# Patient Record
Sex: Female | Born: 1960 | Race: White | Hispanic: No | Marital: Married
Health system: Midwestern US, Community
[De-identification: ages and names within clinical notes are randomized; demographics above are authoritative.]

## PROBLEM LIST (undated history)

## (undated) DIAGNOSIS — S12100A Unspecified displaced fracture of second cervical vertebra, initial encounter for closed fracture: Secondary | ICD-10-CM

## (undated) DIAGNOSIS — M5412 Radiculopathy, cervical region: Principal | ICD-10-CM

## (undated) DIAGNOSIS — R002 Palpitations: Secondary | ICD-10-CM

## (undated) DIAGNOSIS — G930 Cerebral cysts: Secondary | ICD-10-CM

## (undated) DIAGNOSIS — M5416 Radiculopathy, lumbar region: Principal | ICD-10-CM

## (undated) DIAGNOSIS — G8190 Hemiplegia, unspecified affecting unspecified side: Principal | ICD-10-CM

## (undated) DIAGNOSIS — Z01811 Encounter for preprocedural respiratory examination: Secondary | ICD-10-CM

## (undated) DIAGNOSIS — Z87891 Personal history of nicotine dependence: Secondary | ICD-10-CM

## (undated) DIAGNOSIS — M25552 Pain in left hip: Principal | ICD-10-CM

## (undated) DIAGNOSIS — S12112A Nondisplaced Type II dens fracture, initial encounter for closed fracture: Secondary | ICD-10-CM

## (undated) DIAGNOSIS — E278 Other specified disorders of adrenal gland: Secondary | ICD-10-CM

## (undated) DIAGNOSIS — K219 Gastro-esophageal reflux disease without esophagitis: Secondary | ICD-10-CM

## (undated) DIAGNOSIS — R0602 Shortness of breath: Secondary | ICD-10-CM

## (undated) DIAGNOSIS — IMO0002 Reserved for concepts with insufficient information to code with codable children: Secondary | ICD-10-CM

## (undated) DIAGNOSIS — C801 Malignant (primary) neoplasm, unspecified: Secondary | ICD-10-CM

## (undated) DIAGNOSIS — Z9889 Other specified postprocedural states: Secondary | ICD-10-CM

## (undated) DIAGNOSIS — Z87898 Personal history of other specified conditions: Secondary | ICD-10-CM

## (undated) DIAGNOSIS — R112 Nausea with vomiting, unspecified: Secondary | ICD-10-CM

## (undated) DIAGNOSIS — R52 Pain, unspecified: Secondary | ICD-10-CM

## (undated) DIAGNOSIS — Z1231 Encounter for screening mammogram for malignant neoplasm of breast: Secondary | ICD-10-CM

## (undated) HISTORY — PX: OTHER SURGICAL HISTORY: SHX169

---

## 1999-06-25 NOTE — ED Provider Notes (Signed)
[facility]                      EMERGENCY DEPARTMENT TREATMENT REPORT   NAME:  ARIYAN, SINNETT   MR #:  [id number]   BILLING #: [id number]        DOS: 06/25/1999  TIME: 9:53 P   cc:   Primary Physician:  Jason Nest, M.D.   CHIEF COMPLAINT:   "I fell and have a headache."   HISTORY OF PRESENT ILLNESS:   The patient presents at 2030 hours stating   that she fell out of her attic last night, landing on her head, with 5-7   minutes of loss of consciousness and intermittent blurred vision.  The   patient has her friend with her, who states that she was awake and alert   after the 5 minutes, but the patient does not really remember anything   until waking up this morning.  Of note, she continued to move Christmas   decorations and was acting normal through the evening, although she did get   nauseated and vomit several times last night, none since.   The patient also has some pain of her left hip and left small finger, none   of which she feels are broken, just bruised. She has full range of motion   of both and minimal swelling.  The patient does not have any actual neck   pain, but states it feels very stiff on both sides, especially when she   tries to twist or bend it.  No radiation, paresthesias, further loss of   consciousness, or any other symptoms or injuries. She does note that this   has given her "quite a migraine".   REVIEW OF SYSTEMS:   CONSTITUTIONAL:  No fever, chills, weight loss.   HEMATOLOGIC/LYMPHATIC:  No excessive bruising or lymph node swelling.   RESPIRATORY:  No cough, shortness of breath, or wheezing.   CARDIOVASCULAR:  No chest pain, chest pressure, or palpitations.   GASTROINTESTINAL:  No vomiting, diarrhea, or abdominal pain, except as   above.   GENITOURINARY:  No dysuria, frequency, or urgency, denies any chance of   pregnancy.   INTEGUMENTARY:  No rashes.   NEUROLOGICAL:  The patient denies confusion, ataxia, weakness, radiation,    paresthesias, changed vision/speech/hearing, dizziness, loss of   consciousness, or N/V, except as above.   All other systems negative.   PAST MEDICAL HISTORY, FAMILY HISTORY, SOCIAL HISTORY:  All noncontributory.   ALLERGIES:  NONE.   MEDICATIONS:  Xanax, ibuprofen.   PHYSICAL EXAMINATION:   VITAL SIGNS:  Blood pressure 106/66, pulse 86, respirations 20, temperature   98.7.   GENERAL APPEARANCE:  Patient appears well-developed and well-nourished.   Appearance and behavior are age and situation appropriate.   HEENT:   Eyes:  Conjunctiva clear, lids normal.  Pupils equal, symmetrical,   and normally reactive.   Fundi:  Optic discs are normal in appearance, no   gross hemorrhages or exudates seen.   Ears/Nose:  Hearing is grossly intact   to voice.  Internal and external examinations of the ears are unremarkable,   except for serous fluid in the left.  Mouth/Throat:  Surfaces of the   pharynx, palate, and tongue are pink, moist, and without lesions.   ENT:   Without oto/rhinorrhea, hemotympanum, raccoon's eyes, or Battle's sign.   NECK:  Negative pain on palpation/deformity of the spine; positive pain on   palpation/bilateral spasm paraspinous cervical muscles.  RESPIRATORY:  Clear and equal BS.  No respiratory distress, tachypnea, or   accessory muscle use.   CARDIOVASCULAR:  Heart regular, without murmurs, gallops, rubs, or thrills.   PMI not displaced.   CHEST:  Chest symmetrical without masses or tenderness.   GI:  Abdomen soft, nontender, without complaint of pain to palpation.  No   hepatomegaly or splenomegaly.   CHEST/ABDOMEN/PELVIS: Nontender with AP and lateral compression.   SCALP:  There are multiple abrasions and contusions over the occipital,   frontal, left frontal, and left parietal area.  No crepitus or deformity   otherwise.   SKIN:  This is some abrasions over the left hip and left 5th finger, no   signs of infection.  Distal neurovasculars/range of motion intact of both.    NEUROLOGIC:  Cranial nerves, deep tendon reflexes, strength, and light   touch sensation are unremarkable.   PSYCHIATRIC:  Oriented to time, place and person.  Mood and affect   appropriate.  Judgment appears appropriate.  Recent and remote memory   appear to be intact.   IMPRESSION/MANAGEMENT PLAN:   The patient's history was discussed  with   family members.  No additional relevant information was obtained. Nursing   notes were reviewed. The patient will need a CAT scan of her head because   of prolonged loss of consciousness and loss of memory as well as neck   films.   DIAGNOSTIC INTERPRETATION:  CAT scan of the head is read negative by   radiologist.   COURSE IN THE EMERGENCY DEPARTMENT: The patient had a C spine x-ray done   because of the patient's neck pain, and had an abnormal odontoid.  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   In the setting of neurological deficit, MRI of C spine may be warranted.   I explained to the patient and her daughter that she has this congenital   problem, but she has no neurological symptoms tonight.  She is to try to   avoid any head injuries and return if any problems.  She is discharged on   Skelaxin and Vicodin.   DIAGNOSIS:   1.  Closed head injury.   2.  Abrasions face, left hip, left fifth finger.   3.  Congenital neck instability.   DISPOSITION: The patient is discharged home in stable condition, with   instructions to follow up with their regular doctor.  They are advised to   return immediately for any worsening or symptoms of concern.   Electronically Signed By:   Ferdinand Lango. MANOLIO, M.D. 06/27/1999 15:22   ____________________________   Ferdinand Lango. Jama Flavors, M.D.   dh/jh  D:  06/25/1999 T:  06/25/1999  9:57 P   [id number]

## 2001-04-11 NOTE — ED Provider Notes (Signed)
[facility]                      EMERGENCY DEPARTMENT TREATMENT REPORT   NAME:  [name], [name]   MR #:  [id number]   BILLING #: [id number]        DOS: 04/11/2001  TIME: 1:10 P   cc:   Frann Rider, M.D.   Primary Physician:   Frann Rider, M.D.   CHIEF COMPLAINT:  Toothache.   HISTORY OF PRESENT ILLNESS:  [age]year-old white female presents with   severe pain with some swelling in the left lower jaw that has started in   the past few days.  About one week ago she was eating food and a part of a   filling and/or tooth came out of the first molar on the left lower jaw   area.  She did not have pain initially, but in the past few days has   developed pain and now it has become severe and rates it 10/10.  She states   she has used two tubes of Anbesol and has been taking Motrin 800 mg without   relief.  She has called the dentist and was able to get an appointment for   05/16/01.   REVIEW OF SYSTEMS:   CONSTITUTIONAL:  No fever, chills, weight loss.   Denies complaints in any other system.   PAST MEDICAL HISTORY:  No chronic illnesses.   SOCIAL HISTORY:  Positive for smoking.   ALLERGIES:  None.   MEDICATIONS:  Ibuprofen and Anbesol.   PHYSICAL EXAMINATION:   VITAL SIGNS:  Blood pressure 117/76; temperature 97.5; pulse 86;   respirations 24.   GENERAL:  The patient appears significantly uncomfortable secondary to her   left lower jaw pain.   HEENT:  Face:  There is some mild swelling of the left lower jaw area.   Mouth: Tooth #20 does have a filling that is absent with some caries   present and is very tender to even light percussion.  The gingival area   shows no inflammation.   IMPRESSION AND MANAGEMENT PLAN: That of a patient presenting with acute   dental pain and appears to be carious with secondary abscess and will   antibiotics and analgesia until she can see her dentist.   Dr. Claudette Laws examined the patient and agrees with the above history,    physical, and treatment plan.   FINAL DIAGNOSIS:   1.  Acute dental pain, caries/abscess, #20.   DISPOSITION:  The patient is discharged home in stable condition, with   instructions to follow up with their regular doctor.  They are advised to   return immediately for any worsening or symptoms of concern. Pen VK 500 mg   #28, Vicodin #16 and was given two by mouth prior to discharge.  Preprinted   dental abscess instructions given.   Electronically Signed By:   Docia Furl, M.D. 04/15/2001 05:03   ____________________________   Docia Furl, M.D.   cd D:  04/11/2001 T:  04/11/2001  4:40 P   [id number]   Claris Pong, PA

## 2002-10-29 NOTE — ED Provider Notes (Signed)
[facility]                      EMERGENCY DEPARTMENT TREATMENT REPORT   NAME:  [name], [name]                    PT. LOCATION:     ER  NG29   MR #:         BILLING #: [id number]          DOS: 10/29/2002   TIME: 4:36 P   02-60-61   cc:   Primary Physician:   CHIEF COMPLAINT: Rectal pain.   HISTORY OF PRESENT ILLNESS:  A 42 year old female states she got a   thrombosed hemorrhoid.  She has had bleeding on and off for 2 days.  She   states it is very painful and hurts to have a bowel movement.  She is able   to have bowel movements.  She has not been constipated.  She had had   hemorrhoids before.   REVIEW OF SYSTEMS   RESPIRATORY:  No cough, shortness of breath, or wheezing.   CARDIOVASCULAR:  No chest pain, chest pressure, or palpitations.   GASTROINTESTINAL:   Per HPI.   PAST MEDICAL HISTORY:  Hemorrhoids and seasonal allergies.   SOCIAL HISTORY: Here with a friend.   PHYSICAL EXAMINATION:   VITAL SIGNS:  Stable.   GASTROINTESTINAL:   Abdomen is soft, nontender, nondistended.  Rectal   examination:  She has 2 small nonthrombosed hemorrhoids, no bleeding noted.   INITIAL ASSESSMENT AND MANAGEMENT PLAN:  A patient with rectal hemorrhoids.   We will try to reduce them.   COURSE IN THE EMERGENCY DEPARTMENT:  The hemorrhoids were manually reduced   by   Dr. Katharine Look.  She is more comfortable.  We will discharge her home with   [facility], a few Vicodin she can have to take only at night.  She will be   continuing sitz baths and following up with Dr. Isabella Bowens and Dr. Vassie Moselle.   FINAL DIAGNOSIS:  Acute rectal pain secondary to hemorrhoids.   DISPOSITION:  Home.  The patient is discharged home in stable condition,   with instructions to follow up with their regular doctor.  They are advised   to return immediately for any worsening or symptoms of concern.   Electronically Signed By:   Docia Furl, M.D. 10/31/2002 20:28   ____________________________   Docia Furl, M.D.    rew  D:  10/29/2002  T:  10/30/2002  8:55 P   [id number]

## 2005-03-19 NOTE — ED Provider Notes (Signed)
[facility]                      EMERGENCY DEPARTMENT TREATMENT REPORT   NAME:  [name], [name]                    PT. LOCATION:     ER  ER07   MR #:         BILLING #: [id number]          DOS: 03/19/2005   TIME: 9:32 A   02-60-61   cc:    F. Nadara Mustard., M.D.   Primary Physician:   CHIEF COMPLAINT:   Abdominal pain, vomiting, diarrhea.   HISTORY OF PRESENT ILLNESS:   The patient is a healthy 44 year old female   who at a chicken biscuit around 1000 hours several days ago.  About 1400   hours she started experiencing colicky pain, vomiting, and then diarrhea.   Since then she has continued to experience vomiting and diarrhea, has had   generalized malaise, extremity aching, bitemporal headache.  She is   attempting over-the-counter medications with some limited relief but has   continued to experience pain and vomiting.  Because of the progression and   severity of the patient's symptoms, they felt obligated to come to the   emergency department for treatment.   REVIEW OF SYSTEMS:   CONSTITUTIONAL:   The patient has been experiencing chills.  No orthostatic   symptoms.   ENT: No sore throat, runny nose or other URI symptoms.   RESPIRATORY:  No cough, shortness of breath, or wheezing.   CARDIOVASCULAR:  No chest pain, chest pressure, or palpitations.   GENITOURINARY:  No dysuria, frequency, or urgency.   MUSCULOSKELETAL:  No joint pain or swelling.   NEUROLOGICAL:  No headaches, sensory or motor symptoms.   Denies complaints in any other system.   PAST MEDICAL HISTORY:  Negative for surgeries, gastrointestinal problems.   SOCIAL HISTORY: The patient is married.   ALLERGIES:  Codeine, tape.   MEDICATIONS:  None.   PHYSICAL EXAMINATION:   GENERAL:   Alert and uncomfortable.   VITAL SIGNS:  Blood pressure 105/60, pulse 102, respirations 18,   temperature 98.7.  O2 saturations  99%.   HEENT:   Mucous membranes pink and dry.  Airway patent.    RESPIRATORY:  Clear and equal breath sounds.  No respiratory distress,   tachypnea, or accessory muscle use.   CARDIOVASCULAR:  Heart regular, without murmurs, gallops, rubs, or thrills.   PMI not displaced.   ABDOMEN:   Soft with mild poorly localized pain.  Peristalsis is present.   SKIN:  Warm and dry without rashes.   MUSCULOSKELETAL:   Stance and gait appear normal.   NEUROLOGIC:  Cranial nerves, deep tendon reflexes, strength, and light   touch sensation are unremarkable.   INITIAL IMPRESSION/MANAGEMENT PLAN:   The patient presents with acute   abdominal pain, vomiting, requiring evaluation to identify hepatic,   pancreatic, biliary disease, leukocytes or surgical indications.   DIAGNOSTIC STUDIES:   Chemistries were normal. White blood cell count   elevated 15,900, hemoglobin 11.8.  Urine was strongly concentrated,   positive for blood.  Lipase negative.   COURSE IN THE EMERGENCY DEPARTMENT:   The patient was hydrated with IV   fluids, medicated with IV Dilaudid, Phenergan and injection of Bentyl.  A   repeat evaluation at 0920 hours, the patient appeared considerably more   comfortable and  was tolerating liquids.  The patient's family was in the   department and kept up-to-date on the patient's condition and test results.   FINAL DIAGNOSES:      1. Acute abdominal pain.      2. Gastroenteritis secondary to food poisoning.   DISPOSITION:   The patient was discharged with clear liquids, Percocet as   needed for pain.  Follow up her primary care physician.  She may return to   the emergency department any time should there be a change in the patient's   condition or the onset of new or worsening symptoms.   Electronically Signed By:   Thornton Dales, M.D. 03/20/2005   11:04   ____________________________   Thornton Dales, M.D.   dh  D:  03/19/2005  T:  03/19/2005  8:04 P   [id number]

## 2005-03-23 NOTE — ED Provider Notes (Signed)
[facility]                      EMERGENCY DEPARTMENT TREATMENT REPORT   NAME:  [name], [name]                  PT. LOCATION:     ER  ER16   MR #:         BILLING #: [id number]          DOS: 03/23/2005   TIME: 8:02 P   02-60-61   cc:    F. Nadara Mustard., M.D.   Primary Physician:   Time of my Evaluation:  1943.   CHIEF COMPLAINT:  Pain in belly area.   HISTORY OF PRESENT ILLNESS:  This 44 year old female presents complaining   of continued symptoms x 7 days after she ate a chicken biscuit.  Positive   nausea, vomiting, and diarrhea multiple times.  Seen here on 03/19/05.  She   was medicated and felt better that last few days.  She has been eating.   Occasional nausea associated with eating, not necessarily associated with   eating, but sometimes she can/cannot.  Continued abdominal discomfort now   localized to the lower abdominal region.  She has also had a urinary   hesitancy.  No vaginal bleeding since the 03/21/05.  Normal vaginal menstrual   cycle for her. No weakness.  She had a tactile fever the last 3-4 days.   Abdominal cramps are diffuse.  She is concerned and she presents.   REVIEW OF SYSTEMS: CONSTITUTIONAL:  As above.   ENT: No sore throat, runny nose or other URI symptoms.   ENDOCRINE:  No diabetic symptoms.   ALLERGIC/IMMUNOLOGIC:  No urticaria or allergy symptoms.   GASTROINTESTINAL:  As above.   GENITOURINARY:  No dysuria, frequency, or urgency.   NEUROLOGICAL:  No headaches, sensory or motor symptoms.   All other systems negative.   Denies complaints in any other system.   PAST MEDICAL HISTORY:  Noncontributory.   SOCIAL HISTORY:  Here with her husband.  Positive tobacco.  Denies alcohol   or drugs.   ALLERGIES:  Codeine.   MEDICATIONS:  Zantac, Tylenol, Naprosyn.   PHYSICAL EXAMINATION:   VITAL SIGNS:  Blood pressure 118/71, pulse 95, respirations 20, temperature   99.6.  Pain is 9/10.    CONSTITUTIONAL:  A 44 year old female with no complaints as I walk in, and   then she complains while holding her belly that it is cramping.   LYMPHATICS:  No cervical or submandibular lymphadenopathy palpated.  No   inguinal lymphadenopathy palpated.   RESPIRATORY:  Clear and equal.   CHEST:  Chest symmetrical without masses or tenderness.   CARDIOVASCULAR:  Regular rate and rhythm of 90.  No peripheral edema or   significant varicosities. Vascular:   Calves soft and nontender.  No   peripheral edema or significant varicosities. Carotid, femoral, and pedal   pulses are satisfactory.  The abdominal aorta is not palpably enlarged.   GASTROINTESTINAL:  Round, soft, mild, poorly localized discomfort.  Normal   active bowel sounds.  No guarding, no rigidity.  MUSCULOSKELETAL:  Stance   and gait appear normal.   SKIN:  Warm and dry without rashes.  Old remote scar of the umbilicus.   CONTINUATION BY DR. Claudette Laws:   INITIAL ASSESSMENT AND MANAGEMENT PLAN:  The patient was treated by myself   earlier this week for what appeared to be an  acute gastroenteritis;   however, she returned with persistent discomfort and now has some   developing left lower quadrant pain requiring evaluation to identify   diverticulitis or other surgical indications.   DIAGNOSTIC STUDIES: Abdominal pelvic CT demonstrated sigmoid diverticulitis   with a small area of extra luminal fluids suggestive of an early abscess.   Chemistries were normal.  White blood count 11,000, hemoglobin 11.4.   COURSE IN THE EMERGENCY DEPARTMENT:  The patient remained stable. Her pain   was controlled with increments of Dilaudid and Phenergan.  The patient's   family was in the department and kept up-to-date on the patient's condition   and test results.   FINAL DIAGNOSES:      1. Acute left lower quadrant abdominal pain.      2. Acute diverticulitis.   DISPOSITION:  At the time of this dictation, phone consultation with Dr.    Hyacinth Meeker, who is on call for the emergency department, is pending to arrange   final disposition.   Electronically Signed By:   Thornton Dales, M.D. 03/27/2005   11:00   ____________________________   Thornton Dales, M.D.   mw/rew  D:  03/23/2005  T:  03/23/2005  9:08 P   [id number](pt2)   Beverly Sessions, PA-C

## 2005-03-24 NOTE — H&P (Signed)
[facility]                              HISTORY AND PHYSICAL                             [place] W. Hyacinth Meeker, M.D.   NAMENIOBE, DICK   MR #:    [id number]                    ADM DATE:        03/24/2005   BILLING  [id number]                   PT. LOCATION     [id number]   #:   SS #     [id number]   Acie Fredrickson. MILLER, M.D.   cc:    Acie Fredrickson. MILLER, M.D.   CHIEF COMPLAINT:    Abdominal pain.   HISTORY OF PRESENT ILLNESS:   The patient is a 44 year old white female   without significant past medical problems who presents with 7 days of   recurrent nausea, vomiting, diarrhea and diffuse abdominal pain of varying   severity.  Indicates that the pain has been mainly focused in the   suprapubic and left lower quadrant although a few days ago, it was more   focused in the epigastric region.  She has also had some sense of dyspnea   and chest "tightness" in the past week but this has only been when the   abdominal pain became quite severe and dissipated after the abdominal pain   cleared.  For much of the last 7 days, she has not been able to eat or   drink adequately.  She has also had some urinary hesitancy, mild dysuria,   no hematuria.  She does not see any blood in diarrhea.  She has a positive   subjective sense of fevers, chills and sweats.   REVIEW OF SYSTEMS:   A 10-point system was negative other than those things   outlined above.   PAST MEDICAL HISTORY:    None.   PAST SURGICAL HISTORY:   Positive only for removal of tubal pregnancy and   bilateral tubal ligation in 1990.   ALLERGIES:  Reported allergy to codeine causing nausea and itching.   MEDICATIONS:  Only things taken in the past week are Zantac, Tylenol and   Naprosyn for the above mentioned symptoms.   SOCIAL HISTORY:    She is married, smokes about a third of a pack of   cigarettes per day.  Rarely uses alcohol.  No illicit drug use.   FAMILY HISTORY:   Positive for diabetes in her mother.    PHYSICAL EXAMINATION:   VITAL SIGNS:   Temperature 100.4, blood pressure 107/66, heart rate 89,   respiratory rate 20, saturation 98% on room air.   GENERAL:  Patient is alert, oriented X 3, in no acute distress.   HEENT:   Extraocular eye muscles are intact.  Pupils are equal, round and   reactive to light.  There is  no scleral icterus or conjunctival injection.   No nasal discharge.  Oropharynx is free of any erythema or exudate.  Mucous   membranes are moist.   NECK:  Supple without appreciable JVD, lymphadenopathy or thyromegaly.   HEART:  Reveals regular rate and rhythm without murmurs,  rubs or gallops.   LUNGS:  Clear to auscultation bilaterally.   ABDOMEN:  Soft with mild to moderate tenderness in the epigastric region,   also in the suprapubic region, otherwise mild diffuse discomfort to   palpation.  She does seem to have some suprapubic fullness but no discrete   bladder margins palpable.  No hepatosplenomegaly or masses are palpable.   GU/RECTAL:  Exam deferred.   EXTREMITIES:  No clubbing, cyanosis or edema.  Distal pulses are palpable   and strong x4.   NEUROLOGIC:  Cranial nerves II-XII are intact.  She moves extremities x4   without any evidence of weakness and light touch sensation is intact.   PERTINENT DIAGNOSTIC STUDIES:  CT scan of the abdomen which shows focal   wall thickening, fat stranding around the proximal sigmoid colon consistent   with diverticulitis, also 1.5 cm pocket of fluid adjacent to the proximal   sigmoid colon which "may be developing diverticular abscess."  Bladder was   not distended.   Basic chemistry panel was normal in all respects.  SGPT 86, alkaline   phosphatase normal at 112, lipase 769, albumin 2.5, white count 11,   hemoglobin 11.4, platelet count 391,000.   ASSESSMENT AND PLAN:      1. Sigmoid diverticulitis, continue Flagyl and Levaquin IV.  Question of         early abscess formation on CT.  Will monitor this clinically.       2. Pancreatitis.  Etiology of this is unclear.  Patient does not drink         any significant amount of alcohol.  She does have mild elevation of         SGPT, therefore, right upper quadrant ultrasound will be performed to         rule out evidence of choledocholithiasis being behind this.  CT scan         reports the pancreas to be "normal."  Will await follow up evaluation         by our radiologists as well.   Renae Fickle, thank you very much for allowing [place] to participate in the care of   this patient.   Electronically Signed By:   Acie Fredrickson. MILLER, M.D. 03/26/2005 16:57   ____________________________   Acie Fredrickson. Hyacinth Meeker, M.D.   [place].Scarce  D:  03/24/2005  T:  03/24/2005  3:35 P   [id number]

## 2005-03-25 NOTE — Consults (Signed)
[facility]                               CONSULTATION REPORT                         CONSULTANT:  Grier Mitts, M.D.   [name], [name]   BILLING #:  [id number]             DATE OF CONSULT:     03/25/2005   MR #:       [id number]              ADM DATE:            03/24/2005   SS #        [id number]           PT. LOCATION:        [id number]   ATTENDING:  Acie Fredrickson. MILLER, M.D.   cc:    Acie Fredrickson. MILLER, M.D.          Grier Mitts, M.D.   REASON FOR CONSULTATION:   Sigmoid diverticular abscess.   HISTORY OF PRESENT ILLNESS:   A 44 year old white female who has been sick   since last Friday.  Apparently, she had some chicken burrito.  After eating   the outside food, patient started having nausea, vomiting and diarrhea.   She came to the emergency room.  She was diagnosed to have gastroenteritis   and treated.  She came back again.  This time, she started developing   fever, chills and sweating.  She has no appetite.  She has not been eating   very well.  She had diarrhea.  She had a small liquid bowel movement   yesterday.  No blood in the stool.  The pain is felt in the left lower   quadrant and suprapubic area, intense, controlled with pain medications.   She feels a knot in the left lower quadrant.  She denies any weight loss.   For the past 2 weeks prior to that, she has been constipated.   REVIEW OF SYSTEMS:   No weight loss, no chest pain, no shortness of breath.   Rest of the review of systems is unremarkable.  She is otherwise healthy.   PAST MEDICAL HISTORY:   Unremarkable.   PAST SURGICAL HISTORY:   Tubal pregnancy and tubal ligation in 1990.   ALLERGIES:  Codeine   MEDICATIONS:   Zantac, Tylenol, Naprosyn.   SOCIAL HISTORY:   She smokes, social drinker.   FAMILY HISTORY:    Diabetes.   PHYSICAL EXAMINATION:   GENERAL:  She was in pain, not anemic or jaundiced.   VITAL SIGNS:   She is febrile, temperature 101, blood pressure 114/67,   pulse 102.    NECK:  Supple.  No adenopathy.   CHEST:  Symmetrical.   LUNGS:  Air entry normal both sides, no added sounds.   CARDIOVASCULAR SYSTEM:  Heart tone normal, no murmur.   ABDOMEN:  Slightly distended.  Bowel sounds are hypoactive. There is scar   at the umbilicus.  There is tenderness in the left lower quadrant with   voluntary guarding and moderate rebound.  No enlargement of liver or   spleen.  There may be a vague mass palpable in the left lower quadrant.  No   evidence of hernia.  CNS:  No gross deficits.   EXTREMITIES:   Palpable pedal pulses.   PERTINENT DIAGNOSTIC STUDIES:  Electrolytes normal, white count 11,   hemoglobin 11.4.   CT of the abdomen reveals 2.8-cm abscess around the sigmoid with air   bubbles.   DIAGNOSIS:  Sigmoid diverticular abscess.   RECOMMENDATIONS:   A trial of medical management as diverticular disease is   very uncommon in young patients.  It is possible she may have one isolated   diverticulum.  If she responds to antibiotic treatment, she may avoid   sigmoid colon resection and colostomy.  She should be kept n.p.o. except   for ice chips.  She will be given Zosyn and vancomycin.  I will follow the   patient very closely with you.  I have already discussed my recommendation   with Dr. Marc Morgans.   Thank you for this consultation.   Electronically Signed By:   Grier Mitts, M.D. 03/31/2005 12:16   _________________________________   Grier Mitts, M.D.   [place]al[place].Scarce  D:  03/25/2005  T:  03/25/2005 11:14 P   [id number]

## 2005-04-02 NOTE — Discharge Summary (Signed)
[name]                                DISCHARGE SUMMARY   [name], [name]   E:   MR  [id number]                          ADM DATE:      03/24/2005   #:   [name]  [id number]                       DIS DATE:      04/02/2005   #   [name], M.D.   cc:    [name], M.D.          Claudie Leach., M.D.   DISCHARGE DIAGNOSES:      1. Diverticulitis with small abscess suggested by CT.      2. Initial elevated lipase although actual pancreatitis is doubtful and      not supported by a CT.      3. Anemia with hemoglobin most recently stable at 8.9 without evidence      of overt bleed, note has hypokalemia.   DISCHARGE MEDICATIONS:      1. Flagyl 500 mg p.o. t.i.d. to complete a total of 3 weeks antibiotic      therapy.      2. Levaquin 500 mg p.o. q.p.m. to complete 3 weeks.      3. Potassium supplementation.   HOSPITAL COURSE:  The patient is a 44 year old white female who came in   with complaints of abdominal pain, also nausea, vomiting and diarrhea.  She   was found by CT to have evidence of sigmoid diverticulitis with question of   a small early abscess.  The patient also had initial elevation of lipase   although she had had some symptoms suggestive of pancreatitis early on in   the course of her symptoms.  These had abated.  Her CT did not show   evidence of pancreatitis.  The patient does not drink alcohol.  She did   have some mild elevation of SGPT although no alkaline phosphatase.  She had   a normal right upper quadrant ultrasound with no recurrence of these   symptoms.   In regards to the diverticulitis, the patient was started on antibiotic   therapy with Zosyn which was subsequently switched to Levaquin and Flagyl   which she is tolerating well.  The patient is taking p.o. well at the time   of discharge.  She has no pain at the time of discharge and is requesting   to be discharged to home.  The patient will be discharged.  As noted above    there was some question of early abscess formation on the initial CT.   Follow up CT scan was performed on the 15th which showed a small partially   loculated fluid collection posterior to the sigmoid which is felt to not be   significantly changed and other anteromedial collection had markedly   improved.  The patient again looked  very good clinically and was   tolerating p.o. medications.  She was discharged to home to follow up with   Dr. Isabella Torres as an outpatient.  I have asked that she conclude a 3-week course   of antibiotic as opposed to a usual 10-14  days.  The patient has also been   stringently counseled in regards to returning if she develops severe   abdominal pain or fevers and she voices clear understanding of this.  The   patient was also counseled in regards to advancing her diet gradually.   The patient had some hypokalemia which was undergoing repletion still at   the time of discharge although it was improving.  It was 3.0 at discharge.   The patient also after initial IV fluids showed a drop in her hemoglobin   from 11.4 down to a low of 8.6.  It is presently 8.9 at the time of   discharge without any frank bleeding other than some mild hemorrhoidal   bleeding.  The patient does have a decreased iron saturation; therefore   iron supplement will be given and she can follow up with Dr. Isabella Torres for this   as well.   Renae Fickle, thank you for allowing [place] to participate it the care of this patient.   If any questions arise, please feel free to contact me through our office   [phone]).   Electronically Signed By:   [name], M.D. 04/04/2005 18:26   ________________________________   Abigail Fredrickson. Hyacinth Meeker, M.D.   Andria Rhein  D:  04/02/2005  T:  04/02/2005  8:03 P   [id number]

## 2005-06-21 NOTE — Procedures (Signed)
[facility]                                 PROCEDURE NOTE                                Mike Gip, M.D.   [facility] [place], [place]   E:   MR  [id number]                         DATE:            06/21/2005   #:   [name]  [id number]                      PT. LOCATION:    [id number]   #   Mike Gip, M.D.   cc:    F. Nadara Mustard., M.D.          Mike Gip, M.D.   Extra copies to office:  0   REFERRING PHYSICIAN:   Dr. Renae Fickle Old   OPERATOR:   Dr. Nolon Nations   PROCEDURE:   Colonoscopy   INDICATION:   Colon cancer screening in a 44 year old female, status post an episode of   acute diverticulitis in September.  Symptoms have resolved.  Now for   screening colonoscopy.   ASSISTANTS:   Toula Moos and Lynnae January, RN   MEDICATIONS:   Demerol 125 mg, Versed 6 mg given in slow incremental doses   SCOPE:   Olympus PCF-Q180   DESCRIPTION OF PROCEDURE:  The nature of the procedure, including potential   complications such as bleeding, perforation, infection and alternatives,   were discussed with the patient and signed consent was obtained.  The   patient was placed in the left lateral decubitus position.  After adequate   IV monitored sedation a colonoscope was introduced through the rectum and   advanced to the cecum.  It was identified by typical landmarks.  A careful   examination was performed as the scope was withdrawn.  The patient   tolerated the procedure well and there were no apparent complications.   FINDINGS:   Rectal examination was normal with no masses, polyps or fissures.  The prep   was adequate.  There were scattered left-sided diverticula, although not   numerous.  There was some mild associated resulting inflammation.  There   were no masses, vascular lesions or polyps seen.  There were moderate-sized   internal hemorrhoids on retroflexion.  The mucosa was otherwise   unremarkable.   PROCEDURE PERFORMED:   Colonoscopy   IMPRESSIONS:       1. Left-sided colonic diverticulosis with some evidence to suggest      previous diverticulitis.      2. Internal hemorrhoids.      3. No polyps or mass lesions were seen.   RECOMMENDATIONS:      1. Increase dietary fiber.      2. Routine screening colonoscopy at age 22.      3. Follow-up with Dr. Isabella Bowens.   Electronically Signed By:   Mike Gip, M.D. 06/23/2005 13:11   _________________________________   Mike Gip, M.D.   sc  D:  06/21/2005  T:  06/21/2005  4:29 P   [id number]

## 2005-06-21 NOTE — Procedures (Signed)
[facility]                                 PROCEDURE NOTE                                Mike Gip, M.D.   [facility] [place], [place]   E:   MR  [id number]                         DATE:            06/21/2005   #:   Lindley Magnus  [id number]                      PT. LOCATION:    [id number]   #   Mike Gip, M.D.   cc:    F. Nadara Mustard., M.D.          Mike Gip, M.D.   Extra copies to office:  0   REFERRING PHYSICIAN:   Dr. Renae Fickle Old   OPERATOR:   Dr. Nolon Nations   PROCEDURE:   Colonoscopy   INDICATION:   Colon cancer screening in a 44 year old female, status post an episode of   acute diverticulitis in September.  Symptoms have resolved.  Now for   screening colonoscopy.   ASSISTANTS:   Toula Moos and Lynnae January, RN   MEDICATIONS:   Demerol 125 mg, Versed 6 mg given in slow incremental doses   SCOPE:   Olympus PCF-Q180   DESCRIPTION OF PROCEDURE:  The nature of the procedure, including potential   complications such as bleeding, perforation, infection and alternatives,   were discussed with the patient and signed consent was obtained.  The   patient was placed in the left lateral decubitus position.  After adequate   IV monitored sedation a colonoscope was introduced through the rectum and   advanced to the cecum.  It was identified by typical landmarks.  A careful   examination was performed as the scope was withdrawn.  The patient   tolerated the procedure well and there were no apparent complications.   FINDINGS:   Rectal examination was normal with no masses, polyps or fissures.  The prep   was adequate.  There were scattered left-sided diverticula, although not   numerous.  There was some mild associated resulting inflammation.  There   were no masses, vascular lesions or polyps seen.  There were moderate-sized   internal hemorrhoids on retroflexion.  The mucosa was otherwise   unremarkable.   PROCEDURE PERFORMED:   Colonoscopy   IMPRESSIONS:      1. Left-sided colonic diverticulosis  with some evidence to suggest      previous diverticulitis.      2. Internal hemorrhoids.      3. No polyps or mass lesions were seen.   RECOMMENDATIONS:      1. Increase dietary fiber.      2. Routine screening colonoscopy at age 36.      3. Follow-up with Dr. Isabella Bowens.   Electronically Signed By:   Mike Gip, M.D. 06/23/2005 13:11   _________________________________   Mike Gip, M.D.   sc  D:  06/21/2005  T:  06/21/2005  4:29 P   [id number]

## 2005-09-27 NOTE — ED Provider Notes (Signed)
[facility]                      EMERGENCY DEPARTMENT TREATMENT REPORT   NAME:  [name], [name]                    PT. LOCATION:     ER  [phone]   MR #:         BILLING #: [id number]          DOS: 09/27/2005   TIME: 9:02 A   [id number]   cc:    F. Nadara Mustard., M.D.   Primary Physician:   CHIEF COMPLAINT:  Ear pain, cannot see.   HISTORY OF PRESENT ILLNESS:  A 45 year old woman has a 3 day history of ear   pain and swelling.  Also has some decreased hearing and the pain has spread   to her face, a little bit to her neck.  Since yesterday she has had   decrease in her vision in that eye, there has been some watering and   decreased ability to close that eye.  Had some pain in her face this   morning.  She notes a headache which is also left sided and a "tired   feeling" in her left arm.  This morning when she looked in the mirror her   face was drooping on that side and has had decreased ability to close her   eye.  She has not had any speech difficulty, no problems with her arms or   legs.  She did, today, feel like she was having difficulty with her   thinking.  She felt in some sense confused as she was driving and felt the   necessity to pull over.  In terms of her vision she normally wears just   reading glasses.  She covers the affected eye and her vision is less.   REVIEW OF SYSTEMS:   CONSTITUTIONAL:  She had a fever of 100 yesterday.   ENT:  As above.   RESPIRATORY:  No cough, shortness of breath, or wheezing.   CARDIOVASCULAR:  No chest pain, chest pressure, or palpitations.   GASTROINTESTINAL:  No vomiting, diarrhea, or abdominal pain.   GENITOURINARY:  No dysuria, frequency, or urgency.   MUSCULOSKELETAL:  No injury.   NEUROLOGICAL:  As above.   Denies complaints in any other system.   PAST MEDICAL HISTORY:  Diverticulitis, spent some time in the hospital on   one occasion with this, no abdominal pain.    FAMILY HISTORY:  There is no discernible pattern for premature familial   illness.   SOCIAL HISTORY:  Smokes.   ALLERGIES:  Not listed.   MEDICATIONS:  Not listed.   PHYSICAL EXAMINATION:   VITAL SIGNS:  Blood pressure 132/71, pulse 82, respirations 20, temperature   97.9, oxygen saturation not recorded.   GENERAL:  This is a well developed anxious but relatively comfortable   45 year old woman.   LUNGS:  Clear and equal breath sounds, no respiratory distress or   tachypnea.   HEART:  Regular, without significant murmurs, gallops, or rubs.   ABDOMEN:  Soft, nontender, without complaint of pain to palpation.   BACK:  No CVAT.   SPINE:  There is no localized cervical, thoracic, lumbar or sacral bony   tenderness to palpation or fist percussion.  There are no bony step-offs,   ecchymoses, areas of soft tissue swelling or deformities.   HEAD, EARS, EYES,  NOSE, THROAT:  Pupils equal and reactive, extraocular   movements are normal.  She appears to have an early Bells palsy, she does   not completely close her left eye and it does come open easier than the   other side.  The face is not symmetrical, it seems to droop on the left but   improves with voluntary motor movement.  She does have some loss of   forehead musculature as well.  The left external auditory canal is swollen   and tender, the left pinna is symmetrically and mildly swollen, it is not   particularly red or warm.  There is some mild high left anterior cervical   adenopathy or induration, supple, no jugular venous distention, trachea is   midline.  Oropharynx is unremarkable.  She does admit to some left sided   numbness of her tongue.   NEUROLOGICAL:  Reflexes and strength are equal, upper extremity strength   and light touch sensation are equal including shoulder adduction and   abduction, biceps and triceps strength, grip strength, reflexes, light   touch sensation are equal, she does have some subjective "heaviness" in    that upper extremity.  There is no pronator drift.  Finger-to-nose is   equal.  Gait is normal, tandem walk is normal.   INITIAL ASSESSMENT AND PLAN:  Left otitis externa in association with a   Bells phenomenon.  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  She does have some   atypical features including headache, blurred vision, this otitis external   and left arm symptoms.  Discussed with Dr. Minus Liberty who recommends an MRI and   an lumbar puncture if the MRI is negative.  Will control her pain.   CONTINUATION BY DR. FRUMKIN:   COURSE IN THE EMERGENCY DEPARTMENT:  The pain was controlled with small   quantities of morphine.  She was seen by Dr. Corrie Dandy at 12:40.  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  His   recommendation was that "if her MRI is negative" we should treat her with   acyclovir and he would follow her in a week.  Visual acuity was 20/20 in   each eye according to the nurses.  She remained minimally symptomatic,   ambulatory with some moderate complaints of pain, mostly in her ear.   DIAGNOSTIC TESTING:   Sedimentation rate was 11.  CMP entirely   unremarkable.  CBC within normal limits.  MRI of the brain showed "no   signal abnormalities to the brain parenchyma," left mastoiditis, and   increased signal and swelling of the left auricle, left facial nerve has   normal appearance without any abnormal enhancement, mild ethmoid sinusitis.   Diagnoses were left mastoiditis, swelling and edema of the left auricle,   mild ethmoiditis by Dr. Van Clines.   These findings were conveyed to Dr. Minus Liberty, who recommended continuing with   the current plan.  Concurred with my addition of antibiotics for at least   the ethmoid and possibly mastoid sinuses.   FINAL DIAGNOSES:      1. Left mastoiditis and left otitis externa.      2. Ramsay Hunt syndrome.    DISPOSITION:  Prescriptions:  Augmentin 875 b.i.d. for 14 days, Vicodin   tablets #20, acyclovir 400 mg 5 x a day for 10 days, prednisone 60 mg   tapering  to 20 mg over 9 days.  She is given instructions on eye care,   Bell's palsy, follow up with Dr. Minus Liberty in a week, return for fever, worse   headache, increasing weakness, numbness, or confusion, artificial tears and   lubricant, discussed taping the eyes shut, also follow with Dr. Isabella Bowens as   well.  Ambulatory and in minimal distress at discharge.   Electronically Signed By:   Shanna Cisco, M.D. 09/29/2005 09:25   ____________________________   Shanna Cisco, M.D.   My signature above authenticates this document and my orders, the final   diagnosis(es), discharge prescription(s) and instructions in the [facility] record.   jh/cd  D:  09/27/2005  T:  09/27/2005  9:09 A   [id number]

## 2005-09-27 NOTE — Consults (Signed)
[facility]                               CONSULTATION REPORT                        CONSULTANT:  Vladimir Crofts, M.D.   [name], [name]   BILLING #:  [id number]             DATE OF CONSULT:     09/27/2005   MR #:       [id number]              ADM DATE:            09/27/2005   SS #        [id number]           PT. LOCATION:        ER  [id number]   ATTENDING:   cc:    F. Nadara Mustard., M.D.          Vladimir Crofts, M.D.   A neurology consultation has been requested by Dr. Marcelle Overlie for evaluation of   left facial droop, left ear swelling and pain.   HISTORY OF PRESENT ILLNESS:  This is a 45 year old right-handed white   female with an unremarkable past medical and neurological history with the   exception of diverticulitis.  About 3 days ago she noted severe pain in the   left ear, which has persisted and worsened along with swelling of the ear.   This morning she developed left facial droop along with decrease in taste   and some slurring of the speech.  She denies any visual symptoms,   dysphasia, sensory motor symptoms or gait difficulty.  There is no history   of tick bites or any insect or animal bites.  There is no history of any   injury.  She denies any skin rash.   PAST MEDICAL HISTORY:  Otherwise, unremarkable.   FAMILY HISTORY: Noncontributory.   ALLERGIES:   There is no history of any drug allergies.   REVIEW OF SYSTEMS: Otherwise unremarkable.   EXAMINATION: Reveals her to be awake and alert. There was no aphasia or   dysarthria.  Head was normocephalic.  The neck was supple without any   meningeal signs.  The left external ear was swollen, erythematous.  She had   an obvious left lower motor neuron or peripheral facial droop.  Otherwise,   cranial nerves were intact.  Facial sensation was normal.  Corneal reflex   was present bilaterally.  Motor examination revealed normal muscle mass,   tone, and strength in all groups of muscles.  Deep tendon reflexes were    present and symmetrical. There were no involuntary movements.  There were   no sensory deficits.  Babinski sign was absent bilaterally.   Sedimentation rate is normal.  WBC count is normal.   IMPRESSION:  Left Ramsay Hunt's syndrome.   RECOMMENDATIONS:  MRI scan of the brain if normal. May be discharged on   acyclovir with an analgesic p.r.n.  We will be glad to follow-up in the   office.   This has been discussed with Dr. Canary Brim and the patient daughter.   _________________________________   Vladimir Crofts, M.D.   ndt  D:  09/27/2005  T:  09/27/2005  2:46 P   [id number]

## 2006-02-24 ENCOUNTER — Emergency Department (HOSPITAL_COMMUNITY): Admission: EM | Admit: 2006-02-24 | Discharge: 2006-02-24 | Payer: Self-pay | Admitting: Emergency Medicine

## 2006-02-25 ENCOUNTER — Emergency Department (HOSPITAL_COMMUNITY): Admission: EM | Admit: 2006-02-25 | Discharge: 2006-02-25 | Payer: Self-pay | Admitting: Emergency Medicine

## 2006-03-02 ENCOUNTER — Emergency Department (HOSPITAL_COMMUNITY): Admission: EM | Admit: 2006-03-02 | Discharge: 2006-03-02 | Payer: Self-pay | Admitting: Emergency Medicine

## 2006-12-05 NOTE — ED Provider Notes (Signed)
[facility]                      EMERGENCY DEPARTMENT TREATMENT REPORT   NAME:  [name], [name]        PT. LOCATION:    ER  [id number]       DOB:  [birth year]                                                                     AGE:  46   MR #:       BILLING #:           DOS: 12/05/2006  TIME:          SEX:  F   [id number]    [id number]   cc:   CHIEF COMPLAINT:  Pain, left foot.   HPI:  This is a 46 year old female who got her left foot caught between a   riding mower and a piece of metal today.  She states it got crushed in   between.  Complaining of some pain.  No other complaints.   REVIEW OF SYSTEMS:   MUSCULOSKELETAL:  Positive pain radiating to toes.   NEUROVASCULAR:  No numbness of the toes.   PAST MEDICAL HISTORY:  Diverticulitis.   ALLERGIES:  None.   SOCIAL HISTORY:  As described above.   PHYSICAL EXAMINATION:   VITAL SIGNS:  BP 102/67, pulse 82, respirations 18, temperature 98.   GENERAL APPEARANCE:  Patient appears well developed and well nourished.   Appearance and behavior are age and situation appropriate.   EXTREMITIES:  Some edema noted dorsal lateral aspect of the left foot with   a superficial abrasion and some tenderness.  No malleolar tenderness.   Achilles is intact.  Light touch sensation intact of toes.  Normal   capillary refill.   X-RAYS:  X-ray of the foot negative for fracture per radiologist.   FINAL DIAGNOSIS:  Contusion, left foot.   PLAN:  The patient was discharged.  Instructed to apply ice.  _____.  Given   a prescription for Vicodin #12.  Told to follow up with Dr. Isabella Bowens if not   improving in a week.  The patient was evaluated by myself and Dr. Cipriano Mile   who agrees with the above assessment and plan.   Electronically Signed By:   Damien Fusi, M.D. 12/07/2006 09:08   ____________________________   Damien Fusi, M.D.   My signature above authenticates this document and my orders, the final    diagnosis(es), discharge prescription(s) and instructions in the [facility] record.   ST  D:  12/05/2006  T:  12/06/2006 12:16 P   [id number]   DIANA A HOULE, PA-C

## 2007-07-17 DIAGNOSIS — Z87898 Personal history of other specified conditions: Secondary | ICD-10-CM

## 2007-07-17 HISTORY — DX: Personal history of other specified conditions: Z87.898

## 2009-10-29 ENCOUNTER — Encounter: Admission: RE | Admit: 2009-10-29 | Discharge: 2009-10-29 | Payer: Self-pay | Admitting: Sports Medicine

## 2010-02-08 NOTE — Discharge Summary (Signed)
[facility]   Discharge Summary    NAME:  [name], [name]   SEX:   F   ADMIT: 02/08/2010   DISCH:    DOB:   1961-04-12   MR#    [id number]   ACCT#  [id number]       cc: Zonia Kief MD       ADMISSION DIAGNOSIS:   Right upper quadrant abdominal pain.       DISCHARGE DIAGNOSES:   1.  Acute pancreatitis, resolved.     2.  Abdominal pain, resolved.   3.  History of diverticulitis, stable.   4.  Nicotine abuse.  The patient was counseled.     5.  Adrenal adenoma incidental finding on CT scan.     6.  Gastric ulcer.     7.  History of tubal ligation.       DISCHARGE MEDICATIONS:   1.  Prilosec 20 mg twice a day.   2.  Percocet 5/325 mg 1 tab every 4 to 6 hours p.r.n. pain.       BRIEF HISTORY AND HOSPITAL COURSE:   The patient is a 49 year old, Caucasian female who presented to the Emergency    Department with right upper quadrant abdominal pain off and on for the past 3    weeks.  She otherwise does not have any significant past medical history    except for tobacco abuse and history of diverticulitis in the past about 5    years ago for which she was admitted.  She was treated conservatively and    there was some question about a possible abscess at that time, but she did    well.  The pain when she came in was on scale of 7 to 8 out of 10.  She was    started on some pain medication and lipase at that time was 900.  The next day    the lipase was down to 600 and was down to normal prior to discharge to 68.     She was initially on morphine.  She has been doing okay on it and she    switched over to Dilaudid IV and transitioned to p.o. and discharged on    Percocet p.o.  She has a low-grade fever.  Culture was negative.         She has been taking a lot of Motrin because of persistent abdominal pain.  She    denied any nausea.  No vomiting, no diarrhea, no constipation.  She was    started on a PPI with Protonix which was switched over to Prilosec prior to     discharge.  For tobacco abuse, she was on nicotine patch and the patient was    counseled at length to quit.  A CT of the abdomen was done because of the    abdominal pain and history of diverticulitis in the past and CT showed    extensive finding of adrenal adenoma which is stable.  Hence, will follow up    as an outpatient.  Deep venous thrombosis prophylaxis on admission was with    Lovenox.  Pain management was with Dilaudid.       PROCEDURES DONE DURING ADMISSION:   1.  CT of the abdomen.   2.  Right upper quadrant ultrasound.   3.  Blood work.       DISCHARGE EXAMINATION:   VITAL SIGNS:  Temperature is 97.8, heart rate of 66, respirations 16, blood  pressure is 95/55, oxygen saturation is 95% on room air.    GENERAL EXAMINATION:  Awake and alert, afebrile, not pale, anicteric.     Comfortable.  Not in any distress.   HEAD:  Normocephalic, atraumatic head.   EYES:  Pupils react to light and accommodation, extraocular muscles are    intact.  Not jaundiced.   EARS:  Normal hearing.  Grossly look normal.   NOSE:  No lesion seen.  Looks grossly normal.   MOUTH:  Moist mucous membranes, no oral lesion seen.   NECK:  Supple, normal range of motion in the neck.  No JVD.  No neck pain or    neck stiffness.   THORAX/LUNGS:  Clinically clear to auscultation.  No tenderness to palpation.   CARDIOVASCULAR:  Carotid pulses, jugular venous pressure, apical pulse, heart    sounds.  No extra sounds or heart murmurs.     ABDOMEN:   Bowel sounds normal.  Percussion - no tenderness.  No    costovertebral angle tenderness.  No masses felt.   PERIPHERAL VASCULAR:  Normal skin color, peripheral pulses, no edema, no    varicose veins.   MUSCULOSKELETAL:  No joint pain or joint stiffness.   EXTREMITIES:  No clubbing, no cyanosis, no edema, no calf tenderness.   NEUROLOGIC:  Alert and oriented x3.  Normal affect.  Gait was normal.  Normal    deep tendon reflexes with no pathological reflexes.  Sensation to touch was     normal.  No gross focal deficit.  Normal reflexes.   MENTAL STATUS:  Awake and alert, normal affect and memory.       DIAGNOSTIC STUDIES:   CBC:  White count was 5800, hemoglobin of 12, hematocrit of 36, platelets 187.     BMP shows sodium of 142, potassium is 3.7, chloride is 110, bicarbonate 25,    BUN of 9, glucose of 110, creatinine 0.7, lipase level was 268.       FOLLOWUP INSTRUCTIONS:   1.  Follow up with primary care doctor in about a week or two.   2.  Follow up with GI p.r.n.    3.  Return to the Emergency Department if symptoms gets worse.           ___________________   Eartha Inch MD   Dictated By: .    Mauri Reading   D:02/11/2010   T: 02/11/2010 09:11:19   [id number]

## 2010-02-08 NOTE — H&P (Signed)
[facility]   History and Physical   NAME:  [name], [name]   SEX:   F   ADMIT: 02/08/2010   DOB:[birth year]   MR#    [id number]   ROOM:     ACCT#  [id number]       I hereby certify this patient for admission based upon medical necessity as    noted below:       &lt;cc: Zonia Kief MD       CHIEF COMPLAINT:   Right upper quadrant pain.       HISTORY OF PRESENT ILLNESS:   The patient is a 49 year old Caucasian female who presented to the Emergency    Room with complaint of abdominal pain going on and off for the last 3 weeks.     The patient mentions that she has otherwise been doing well and fairly    healthy, but has recently started with abdominal pain on and off, especially    getting worse after meals.  No complaints of any chest pain or shortness of    breath, no headaches, no vision changes.  Does complain of nausea.  Also    complained of some heartburn with some sour taste.  The patient denies any    complaints of any fevers.  The maximum she had was 100.1.       PAST MEDICAL HISTORY:   Diverticulitis about 5 or 6 years ago.       PAST SURGICAL HISTORY:   Tubal ligation.       ALLERGIES:   NO KNOWN DRUG ALLERGIES.       HOME MEDICATIONS:   None other than p.r.n. ibuprofen.       SOCIAL HISTORY:   The patient smokes about 3/4 pack a day.  Smoked pretty much all her life.     She denies any alcohol use.  No other drug use.  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       REVIEW OF SYSTEMS:   Per history of present illness.   GENERALLY:  No fever, night sweats, weight loss or change in appetite.   ENT:  No sinus problems, hearing problems, vision problems or hearing    deficits.   ENDOCRINE:  No history of diabetes, hypertension, hypercholesterolemia,    thyroid disease or goiter or hypercalcemia.   CARDIOVASCULAR:  No history of chest pain, chest pain on exertion, dyspnea on    exertion, paroxysmal nocturnal dyspnea, orthopnea or leg swelling.  No     history of palpitations or irregular heartbeat or atrial fibrillation.  No    history of valvular disease or rheumatic fever.  No history of enlarged heart.     No history of passing out.   RESPIRATORY:  No history of asthma, TB, pneumonia, emphysema, chronic    bronchitis, chronic sputum production, hemoptysis, TB or TB exposure.  No    history of pulmonary embolism.   GI:  No history of difficulty swallowing or food sticking, gastritis, ulcer    disease, melena, hematochezia, hematemesis, change in bowel habits,    gallbladder disease, pancreatitis, diverticulitis or chronic abdominal pains.   GU:  No history of kidney stone, kidney infection, kidney failure, blood,    protein, or pus in the urine.  No history of urinary incontinence.     MUSCULOSKELETAL:  No history of claudication, DVT, joint pains, arthritis,    back pains, or disc problems or sciatica.  NEUROLOGICAL:  No history of seizure, stroke, unilateral numbness, weakness or    blindness.  No history of migraine headaches or other peripheral nerve    deficits.   DERMATOLOGIC:  No history of changing moles, skin cancer, psoriasis or    photosensitivity.   HEMATOLOGIC:  No history of blood transfusions, anemia or bleeding dyscrasia.       PHYSICAL EXAMINATION:   VITAL SIGNS:  Blood pressure 130/81, pulse 83, respirations 18, temperature    98.4.   GENERAL:  This patient is in no acute distress.  She is alert, awake and    oriented times 3.   HEENT:  Head normocephalic.  Pupils are equal, round and reactive to light.     Sclerae are nonicteric.  Conjunctivae are not injected.  Nares are patent.  No    epistaxis.  Oropharynx nonerythematous and no exudates.   NECK:  Supple, no JVD, no carotid bruit, no thyroid tenderness.   LUNGS:  Good air entry, clear to auscultation.  No rhonchi or wheezing or any    crackles.   HEART:  S1 and S2 present.  Regular rate and rhythm with no murmurs.   ABDOMEN:  Soft, bowel sounds are present, no tenderness.  Right upper  quadrant    and epigastric area tenderness is noted.  No hepatosplenomegaly.   EXTREMITIES:  No edema, no cyanosis or any clubbing.   NEUROLOGIC:  Cranial nerves II-XII are intact.  Sensation is intact to light    touch.  Power is equal bilaterally.   SKIN:  No rash, no lesions, no palpable nodules.         Ultrasound of the right upper quadrant shows normal in nature.       LABORATORY DATA:   Sodium 142, potassium 4.9, chloride 111, bicarbonate 23, BUN 17, creatinine    0.7.  Blood sugar is 96.  Lipase is 917.  WBC 6.1, hemoglobin 19.9, hematocrit    40.4, platelets 218.  Urinalysis is pretty much negative.       ASSESSMENT AND PLAN:   1.  Abdominal pain with nausea.  The patient will be admitted for acute    pancreatitis and acute gastritis.  Intravenous pain medications will be    provided to her, intravenous antiemetics and also the patient will be started    on some Protonix once a day.  Working diagnoses will include acute    pancreatitis and acute gastritis.   2.  Nicotine usage.  The patient will be started on some nicotine patch.   3.  Deep venous thrombosis prophylaxis.  The patient will be started on    sequential compressive devices and will be ambulated as tolerated.           ___________________   Lindwood Coke MD   Dictated By: .    sc   D:02/08/2010   T: 02/08/2010 14:45:01   [id number]

## 2010-02-08 NOTE — ED Provider Notes (Signed)
[facility]   EMERGENCY DEPARTMENT TREATMENT REPORT   NAME:  [place], [place]   SEX:   F   ADMIT: 02/08/2010   DOB:   12-Jan-1961   MR#    [id number]   ROOM:     TIME SEEN: 11 40 AM   ACCT#  [id number]       cc: Zonia Kief MD       I hereby certify this patient for admission based upon medical necessity as    noted below:       PRIMARY CARE PHYSICIAN:   Zonia Kief, MD       EVALUATION TIME:   On 02/08/2010 was 11:30 a.m.       CHIEF COMPLAINT:   Rid and side pain.       HISTORY OF PRESENT ILLNESS:   This is a 49 year old female who comes in stating that she has had right upper    quadrant abdominal pain intermittently for the last 6 weeks.  She said when    it first started it was usually just once a week.  It then became multiple    times a week and now for the last 2 days she has been having it often.  It is    not constant.  She is nauseated, but not vomiting.  She states that she has    not had any fevers or chills.  She notices the pain gets worse after eating.     It is better with ibuprofen.  With the persistence for the last 2 days, she    finally decided to come in for evaluation.       REVIEW OF SYSTEMS:   CONSTITUTIONAL:  Has not had a fever or chill.   EYES:  No visual symptoms.    ENT:  No sore throat, runny nose, or other upper respiratory infection    symptoms.    ENDOCRINE:  No diabetic symptoms.    RESPIRATORY:  No cough, shortness of breath, or wheezing.    CARDIOVASCULAR:  No chest pain, chest pressure, or palpitations.    GASTROINTESTINAL:  Abdominal pain.   GENITOURINARY:  No dysuria, frequency, or urgency.    MUSCULOSKELETAL:  No joint pain or swelling.    INTEGUMENTARY:  No rashes.    NEUROLOGICAL:  No headaches, sensory or motor symptoms.        PAST MEDICAL HISTORY:   Diverticulitis and tubal ligation.       SOCIAL HISTORY:   The patient is here with a friend.  She is a smoker.       FAMILY HISTORY:   Gallbladder.       MEDICATIONS:   Ibuprofen.       ALLERGIES:   NONE.        PHYSICAL EXAMINATION:   GENERAL APPEARANCE:  The patient appears well developed and well nourished.      Appearance and behavior are age and situation appropriate.    VITAL SIGNS:  Blood pressure 130/81, pulse 83, respirations 18, temperature    98.4%, 98% on room air, 8 out of 10 pain.   HEENT:  Eyes:  Conjunctivae clear, lids normal.  Pupils equal, symmetrical,    and normally reactive.  Mouth/Throat:  Surfaces of the pharynx, palate, and    tongue are pink, moist and without lesions.    NECK:  Supple, nontender, symmetrical, no masses or JVD, trachea midline,    thyroid not enlarged, nodular, or tender.    LYMPHATICS:  No  cervical or submandibular lymphadenopathy palpated.    RESPIRATORY:  Clear and equal breath sounds.  No respiratory distress,    tachypnea, or accessory muscle use.    HEART:  Regular, without significant murmurs, gallops, rubs, or thrills.  PMI    not displaced.  No peripheral edema.  Calves soft, nontender, equal distal    pulses.   GASTROINTESTINAL:  Abdomen is soft.  The patient has tenderness in the    midepigastric, right upper quadrant and right mid- abdomen.  There is no    rebound, no guarding.  Bowel sounds are present.  No lower abdominal    tenderness.   SKIN:  No rash.   NEUROLOGIC:  The patient is awake, alert and interacting appropriately.  No    gross focal deficits on exam.   PSYCHIATRIC:  Judgment appears appropriate.        CONTINUATION BY DR. Marcial Pacas NELSON:       DIAGNOSTIC IMPRESSION:       I saw the patient with physician's assistant Marlana Salvage.  The patient    presents with upper abdominal pain that has been intermittent for the last 6    weeks, but has become more constant over the last 2 days.  Differential    diagnosis includes gallbladder disease, peptic ulcer disease and pancreatitis.     Lab work today shows unremarkable electrolytes, liver enzymes unremarkable.     Lipase was 917.  CBC is normal.  Gallbladder ultrasound shows a normal right     upper quadrant ultrasound.  Urinalysis is negative for leukocytes, nitrites,    trace _____, blood, specific gravity is concentrated greater than 1.030.     With elevated lipase and a normal right upper quadrant ultrasound I spoke with    the [facility], Dr. Zachery Conch, regarding  admission.        DIAGNOSES:   1.  Abdominal pain (right upper quadrant).   2.  Pancreatitis (acute).       PLAN:   The patient is being admitted to a regular bed to [facility].           ___________________   Gwenyth Allegra MD   Dictated By: Marlana Salvage, PA   sc   D:02/08/2010   T: 02/08/2010 11:49:26   [id number]

## 2010-02-28 NOTE — ED Provider Notes (Signed)
KNOWN ALLERGIES     Morphine Sulfate - Itching, Seasonal       TRIAGE   PATIENT: NAME: [name], AGE: 49, GENDER: female, DOB: [birth year]         Dec 02, 1960, TIME OF GREET: Tue Feb 28, 2010 11:17, SSN: [id number],         KG WEIGHT: 90.7 (est.), HEIGHT: 175cm, MEDICAL RECORD NUMBER: [id number],         ACCOUNT NUMBER: [id number], PCP: Melodie Bouillon,.   ADMISSION: URGENCY: 3, DEPT: Emergency, BED: 2ED 31.   VITAL SIGNS: BP 119/78, (Sitting), Pulse 75, Resp 18, Temp 98.4,         (Oral), Pain 9, O2 Sat 97, on Room air, Time 02/28/2010 11:25.   COMPLAINT:  Abd Pain,diarrhea with blood.   PRESENTING COMPLAINT:  abd pain with bright red blood in stool.   PAIN: Patient complains of pain, no radiating, cramping and         sharp, Pain is constant, No modifying factors.     Triage assessment performed.   IMMUNIZATIONS:  Last tetanus shot received less than 5 years ago,         flu shot up to date.   LMP: LMP: Menopause.   TB SCREENING: TB screen negative for this patient.   ABUSE SCREENING: Patient denies physical abuse or threats.   FALL RISK: Patient has Low risk of falling, No history of         falling, No secondary Diagnosis, None/bed rest/nurse assist, No IV or         IV Access, Normal/bed rest/wheelchair, Oriented to own ability, Total         Morse Fall Scale Score is: 0.   SUICIDAL IDEATION: Suicidal ideation is not present.   ADVANCE DIRECTIVES: Patient does not have advance directives.   PROVIDERS: TRIAGE NURSE: Donah Driver, RN.   PREVIOUS VISIT ALLERGIES: Nkda.       CURRENT MEDICATIONS   tylenol   Bentyl:  20 mg Oral See Notes. before meals and at bedtime.   Prilosec:  20 mg Oral 2 times a day.       MEDICATION SERVICE   Dilaudid:  Order: Dilaudid (Hydromorphone Hydrochloride) -         Dose: 1 mg : IV         Ordered by: Mikal Plane, PA-C         Entered by: Mikal Plane, PA-C Tue Feb 28, 2010 12:42 ,          Acknowledged by: York Ram, RNTue Feb 28, 2010 12:45          Documented as given by: York Ram, RN Tue Feb 28, 2010 13:01          Patient, Medication, Dose, Route and Time verified prior to         administration.          Time given: 1257, Amount given: 1 mg, IV site 1, IVP, Initial         medication, Slowly, Catheter placement confirmed via flush prior to         administration, IV site without signs or symptoms of infiltration         during medication administration, No swelling during administration,         No drainage during administration, IV flushed after administration,         Correct patient, time, route, dose and medication confirmed prior to  administration, Patient advised of actions and side-effects prior to         administration, Allergies confirmed and medications reviewed prior to         administration, Patient in position of comfort, Side rails up, Cart         in lowest position, Family at bedside.    : Follow Up : Time: 1420, On a scale 0-10 patient rates pain as         2, Decreased symptoms, Advised not to ambulate without assistance,         Patient in position of comfort, Side rails up, Cart in lowest         position, Family at bedside.   Pepcid:  Order: Pepcid (Famotidine) - Dose: 20 mg : IV         Ordered by: Mikal Plane, PA-C         Entered by: Mikal Plane, PA-C Tue Feb 28, 2010 12:42 ,          Acknowledged by: York Ram, RNTue Feb 28, 2010 12:45         Documented as given by: York Ram, RN Tue Feb 28, 2010 13:01          Patient, Medication, Dose, Route and Time verified prior to         administration.          Time given: 1259, Amount given: 20 mg, IV site 1, IVP, Initial         medication, Slowly, Catheter placement confirmed via flush prior to         administration, IV site without signs or symptoms of infiltration         during medication administration, No swelling during administration,         No drainage during administration, IV flushed after administration,          Correct patient, time, route, dose and medication confirmed prior to         administration, Patient advised of actions and side-effects prior to         administration, Allergies confirmed and medications reviewed prior to         administration, Patient in position of comfort, Side rails up, Cart         in lowest position, Family at bedside, mixed in 10 cc NS flush.   Zofran:  Order: Zofran (Ondansetron Hydrochloride) -         Dose: 4 mg : IV         Ordered by: Mikal Plane, PA-C         Entered by: Mikal Plane, PA-C Tue Feb 28, 2010 12:42 ,          Acknowledged by: York Ram, RNTue Feb 28, 2010 12:45         Documented as given by: York Ram, RN Tue Feb 28, 2010 13:01          Patient, Medication, Dose, Route and Time verified prior to         administration.          Time given: 1255, Amount given: 4 mg, IV site 1, IVP, Initial         medication, Slowly, Catheter placement confirmed via flush prior to         administration, IV site without signs or symptoms of infiltration         during medication administration, No swelling during administration,  No drainage during administration, IV flushed after administration,         Correct patient, time, route, dose and medication confirmed prior to         administration, Patient advised of actions and side-effects prior to         administration, Allergies confirmed and medications reviewed prior to         administration, Patient in position of comfort, Side rails up, Cart         in lowest position, Family at bedside.       INSTRUCTION   DISCHARGE:  RECTAL BLEEDING - WITHOUT ANOSCOPY (BRBPR), DIARRHEA         -([facility]).   FOLLOWUPMelodie Bouillon, GMD, [address] [place]         [id number], [phone], Reatha Harps, GASTROENTEROLOGY, [address], [place] [address], [phone].   SPECIAL:  Follow up with primary care physician and Dr. Nolon Nations to         be evaluated for colonoscopy          Percocet for pain, proctofoam for hemorrhoids         Stay well hydrated         Return to the ER if condition worsens or new symptoms develop.   Key:     ARH1=Hayes, RN, Sue Lush  JOHO=Hubbard, PA-C, Joya Gaskins, RN,     Belenda Cruise     TAP1=Parker, MD, Tawanna Cooler

## 2010-02-28 NOTE — ED Provider Notes (Signed)
[facility]   EMERGENCY DEPARTMENT TREATMENT REPORT   NAME:  [name], [name]   SEX:   F   ADMIT: 02/28/2010   DOB:   Sep 23, 1960   MR#    [id number]   ROOM:     TIME SEEN: 12 59 PM   ACCT#  [id number]       cc: Forrest Nadara Mustard. MD       CHIEF COMPLAINT:   Abdominal pain, diarrhea with  blood.       HISTORY OF PRESENT ILLNESS:   This is a 49 year old female who was in the hospital at the end of July with    pancreatitis, etiology unknown.  States she done fairly well.  She has been    intermittent pain in her abdomen since that time, it got much worse about 2    a.m. this morning with pain in her upper abdomen radiating to her back and    associated with stomach cramping and diarrhea, which she describes as "red and    water."  She states some undigested food was mixed with it as well.  She has    had about 6 episodes of that.  No fever or chills.  She has not been on    antibiotics.  No known sick contacts.  Has not traveled anywhere.  Currently,    rates the pain a 9 on a 0 to  10 scale.       REVIEW OF SYSTEMS:   CONSTITUTIONAL:  No fever, chills, weight loss.   ENT:  No sore throat, runny nose, or other URI symptoms.    RESPIRATORY:  No cough, shortness of breath, or wheezing.    CARDIOVASCULAR:  No chest pain, chest pressure, or palpitations.    GASTROINTESTINAL:  Positive for nausea.  Positive for abdominal pain,    abdominal cramping and diarrhea with bright red blood as mentioned above.   GENITOURINARY:  No urinary symptoms.   MUSCULOSKELETAL:  No leg pain or swelling.   INTEGUMENTARY:  No rashes.   NEUROLOGICAL:  No headaches, sensory or motor symptoms.   Denies complaints in all other systems.       PAST MEDICAL HISTORY:   She does have a history of diverticulitis about 4 or 5 years ago, states that    was investigated while she was here recently as well.  She also has a history    of pancreatitis.       MEDICATIONS:   Prilosec.       ALLERGIES:   NONE.       SOCIAL HISTORY:    Positive for tobacco.  She is currently working on cutting back.  No history    of travel.       FAMILY HISTORY:   Noncontributory.       PHYSICAL EXAMINATION:   GENERAL:  This is a well-developed female.   VITAL SIGNS:  Blood pressure 105/59, pulse 81, respirations 18, temperature    98.4, O2 sats 98% on room air.   HEENT:  Eyes:  Conjunctivae are clear.  Sclerae anicteric.  Mouth/Throat:     Mucous membranes pink, throat is clear.   NECK:  Supple, nontender, symmetrical, no masses or JVD, trachea midline,    thyroid not enlarged, nodular, or tender.     LUNGS:  Clear to auscultation.   HEART:  Regular rate and rhythm.   ABDOMEN:  Nondistended, bowel sounds present, soft.  She is tender in the    epigastric  area, which does reproduce her pain.  No organomegaly or masses.     There is no real lower abdominal tenderness.  No organomegaly, no masses.     Rectal examination shows some mucousy material that was heme negative.  She    does have some hemorrhoids present on examination.   BACK:  Nontender.   EXTREMITIES:  Warm and dry.  Calves soft, nontender.   NEUROLOGIC:  She is awake, alert and oriented.       CONTINUATION BY JULIA HUBBARD, PA:       INITIAL ASSESSMENT AND MANAGEMENT PLAN:   This is a 49 year old female presents for evaluation of abdominal pain, mostly    epigastric.  Also, has some generalized abdominal cramping and some watery    stools that are reddish in color.  She is heme negative here.  At this time,    an IV will be established.  She will be medicated for pain.  A CBC, CMP and    lipase and urine will be obtained and we will proceed from there.       DIAGNOSTIC STUDIES:   Lipase was normal at 196.  CMP within normal limits.  CBC was normal.  Urine    essentially negative with some trace blood seen.       COURSE IN THE EMERGENCY DEPARTMENT:   The findings were discussed.  Dr. Jimmey Ralph was in to see the patient.  On    reexam, she was comfortable, was feeling much better.  She does mention that     there was a stomach bug going around her office, discussed this might be the    etiology of her symptoms and discussed follow up with her.  We will give her    the name of Dr. Nolon Nations to follow up with, who apparently she has seen before.     Prescriptions for Percocet and proctofoam to help with her hemorrhoids.  She    understands.  She will be discharged with rectal bleeding and diarrhea    instructions.  She can certainly return anytime worsening or Abigail concerns and    she does verbalize she understands that.       FINAL DIAGNOSES:   1.  Acute colitis.   2.  Abdominal pain, resolved.   3.  Hemorrhoids.       DISPOSITION AND PLAN:   The patient was discharged home in stable condition to follow up as above.     The patient was examined by myself and Dr. Floyce Stakes who agrees with the    above assessment and plan.           ___________________   Johny Drilling MD   Dictated By: Maurice Small. Carl, [place]   tc   D:02/28/2010   T: 03/01/2010 08:29:44   [id number]

## 2010-05-09 NOTE — ED Provider Notes (Signed)
KNOWN ALLERGIES   Morphine Sulfate: - itching   Seasonal (Unconfirmed)       TRIAGE   PATIENT: NAME: [name], AGE: 49, GENDER: female, DOB: [birth year]         March 31, 1961, TIME OF GREET: Tue May 09, 2010 10:30, [place]: [id number],         KG WEIGHT: 17 (est.), MEDICAL RECORD NUMBER: [id number] NUMBER:         [id number], PCP: Melodie Bouillon,.   ADMISSION: URGENCY: 3, DEPT: Emergency, BED: WAITING.   VITAL SIGNS: BP 126/73, (Sitting), Pulse 114, Resp 16, Temp 102,         (Oral), Pain 10, Time 05/09/2010 10:32.   COMPLAINT:  Fever Back Stomach Pain.   PRESENTING COMPLAINT:  UNDER TREATMENT FOR UTI FOR 3 DAYS. PAIN         IS GETTING WORSE AND IS NOW IN BACK. VOMITED TWICE THIS AM. FEVER OF         102 LAST PM.   PAIN: Patient complains of pain, Pain described as sharp, Pain         described as stabbing, On a scale 0-10 patient rates pain as 10, Pain         is constant.   LMP: LMP: Menopause.   TB SCREENING: TB screen negative for this patient.   ABUSE SCREENING: Patient denies physical abuse or threats.   FALL RISK: Fall risk assessment not applicable to this patient.   SUICIDAL IDEATION: Suicidal ideation is not present.   ADVANCE DIRECTIVES: Patient does not have advance directives,         Triage assessment performed.   PROVIDERS: TRIAGE NURSE: Annamary Carolin, RN.   PREVIOUS VISIT ALLERGIES: Morphine Sulfate - Itching, Seasonal.       CURRENT MEDICATIONS   Cipro:  500 mg 2 times a day.   Motrin:  600 mg. Last Taken: 0730.       MEDICATION SERVICE   Acetaminophen:  Order: Acetaminophen - Dose: 975 mg :         Oral         Ordered by: Lucita Ferrara, MD         Entered by: Annamary Carolin, RN Tue May 09, 2010 10:37          Documented as given by: Annamary Carolin, RN Tue May 09, 2010 10:37          Patient, Medication, Dose, Route and Time verified prior to         administration.          Site: Medication administered P.O., Correct patient, time, route,         dose and medication confirmed prior to administration, Patient          advised of actions and side-effects prior to administration,         Allergies confirmed and medications reviewed prior to administration.   Cipro I.V.:  Order: Cipro I.V. (Ciprofloxacin         Hydrochloride/Dextrose) - Dose: * : IV         Notes: 400mg  IVPB         Ordered by: Ronne Binning, MD         Entered by: Ronne Binning, MD Tue May 09, 2010 14:10 ,          Acknowledged by: Jolly Mango, RN, CEN Tue May 09, 2010 14:11         Documented as given  by: Edman Circle, RN Tue May 09, 2010 14:36          Patient, Medication, Dose, Route and Time verified prior to         administration.          Amount given: 400mg , IV site 1, Medication administered into left         AC, Drip/IVPB, Premixed, Infusion time 1 hour, on an IV pump, at 200         ml/hr, Catheter placement confirmed via flush prior to         administration, IV site without signs or symptoms of infiltration         during medication administration, No swelling during administration,         No drainage during administration, IV flushed after administration,         Correct patient, time, route, dose and medication confirmed prior to         administration, Patient advised of actions and side-effects prior to         administration, Allergies confirmed and medications reviewed prior to         administration, Patient in position of comfort, Side rails up, Cart         in lowest position, Family at bedside, Call light in reach.    : Follow Up : IVPB stop time: 1525.   Dilaudid:  Order: Dilaudid (Hydromorphone Hydrochloride) -         Dose: 1 mg : IV         POTENTIAL ALLERGY REACTION: 'Morphine Sulfate' - Physician Discretion         - Benefits outweigh the allergy risks         Single Dose Exceeded - Rationale: Benefits out weigh risk         Ordered by: Ronne Binning, MD         Entered by: Ronne Binning, MD Tue May 09, 2010 12:34 ,          Acknowledged by: Edman Circle, RN Tue May 09, 2010 12:48          Documented as given by: Edman Circle, RN Tue May 09, 2010 13:24          Patient, Medication, Dose, Route and Time verified prior to         administration.          Time given: 1320, Amount given: 1mg , IV site 1, Medication         administered into left AC, IVP, Initial medication, Slowly, Catheter         placement confirmed via flush prior to administration, IV site         without signs or symptoms of infiltration during medication         administration, No swelling during administration, No drainage during         administration, IV flushed after administration, Correct patient,         time, route, dose and medication confirmed prior to administration,         Patient advised of actions and side-effects prior to administration,         Allergies confirmed and medications reviewed prior to administration,         Patient in position of comfort, Side rails up, Cart in lowest         position, Family at bedside, Call light in reach.   Ketorolac Tromethamine:  Order: Ketorolac Tromethamine -  Dose: 30 mg : IV         POTENTIAL SEVERE INTERACTION: Motrin - Low risk interaction, Not true         allergy         Ordered by: Phil Dopp, PA-C         Entered by: Phil Dopp, PA-C Tue May 09, 2010 11:03 ,          Acknowledged by: Jolly Mango, RN, CEN Tue May 09, 2010 11:10         Documented as given by: Jolly Mango, RN, CEN Tue May 09, 2010         11:22          Patient, Medication, Dose, Route and Time verified prior to         administration.          Time given: 1119, Amount given: 30 mg, IV site 1, IVP, Initial         medication, Slowly, Catheter placement confirmed via flush prior to         administration, IV site without signs or symptoms of infiltration         during medication administration, No swelling during administration,         No drainage during administration, IV flushed after administration,         Correct patient, time, route, dose and medication confirmed prior  to         administration, Patient advised of actions and side-effects prior to         administration, Allergies confirmed and medications reviewed prior to         administration, Patient in position of comfort, Side rails up, Cart         in lowest position, Family at bedside.   Zofran:  Order: Zofran (Ondansetron Hydrochloride) -         Dose: 4 mg : IV         Ordered by: Phil Dopp, PA-C         Entered by: Phil Dopp, PA-C Tue May 09, 2010 11:03 ,          Acknowledged by: Jolly Mango, RN, CEN Tue May 09, 2010 11:10         Documented as given by: Jolly Mango, RN, CEN Tue May 09, 2010         11:21          Patient, Medication, Dose, Route and Time verified prior to         administration.          Time given: 1118, Amount given: 4 mg, IV site 1, IVP, Initial         medication, Slowly, Catheter placement confirmed via flush prior to         administration, IV site without signs or symptoms of infiltration         during medication administration, No swelling during administration,         No drainage during administration, IV flushed after administration,         Correct patient, time, route, dose and medication confirmed prior to         administration, Patient advised of actions and side-effects prior to         administration, Allergies confirmed and medications reviewed prior to         administration, Patient in position of comfort, Side rails up, Cart  in lowest position, Family at bedside.       INSTRUCTION   DISCHARGE:  PYELONEPHRITIS (KIDNEY INFECTION).   FOLLOWUP:  OLD Jeani Hawking, GMD, [address] [place]         [address], [phone], Mikeal Hawthorne, GMD, [address] [place] [address], [phone].   SPECIAL:  Follow up with your primary care physician in two to         three days.         Return to the ER if condition worsens or new symptoms develop.   Key:     JAG3=Gilbert, RN, Victorino Dike  LDD0=David, RN, Lyn  LLW1=Woodard, MD, Heywood Iles, PA-C, Saddie Benders, MD, Rob  RND1=Damweber, RN,     CEN, Zella Ball

## 2010-05-09 NOTE — ED Provider Notes (Signed)
[facility]   EMERGENCY DEPARTMENT TREATMENT REPORT   NAME:  Rober[place], [place]   SEX:   F   ADMIT: 05/09/2010   DOB:   09-Dec-1960   MR#    [id number]   ROOM:     TIME SEEN: 01 54 PM   ACCT#  [id number]       cc: Forrest Nadara Mustard MD       PRIMARY CARE PHYSICIAN:   Nadara Mustard., MD        TIME OF EVALUATION:   1045.       CHIEF COMPLAINT:   Fever and flank pain.       HISTORY OF PRESENT ILLNESS:   This is a 49 year old female who presents for evaluation of fever and flank    pain.  The patient states she has been experiencing right-sided back pain    since last evening also reports intermittent radiation of pain wrapping from    front to right upper quadrant.  She states that the pain is sharp.  The    patient denies relief of pain with Motrin.  Also reports dysuria, nausea,    vomiting times 2 episodes.  The patient reports a 4-day history of dysuria.     States she presented to her primary care physician's office yesterday for    evaluation and was diagnosed with a UTI and started on Cipro.  She was also    found to be febrile with a fever of 103.2.  After experiencing worsening pain,    the patient was told that primary care physician to present to the Emergency    Department for further evaluation.       REVIEW OF SYSTEMS:   CONSTITUTIONAL:  Fever.   ENT: No sore throat, runny nose or other URI symptoms.     RESPIRATORY:  No cough, shortness of breath or wheezing.     CARDIOVASCULAR:  No chest pain, chest pressure or palpitations.     GASTROINTESTINAL:  Abdominal pain, vomiting.   GENITOURINARY:  Dysuria.   MUSCULOSKELETAL:  Flank pain.   INTEGUMENTARY:  No rashes.      NEUROLOGICAL:  No headaches, sensory or motor symptoms.         PAST MEDICAL HISTORY:   Diverticulitis.       FAMILY HISTORY:   Noncontributory.       SOCIAL HISTORY:   Smoker.       MEDICATIONS:   Reviewed in [place].       ALLERGIES:   MORPHINE CAUSING RASH.       PHYSICAL EXAMINATION:    VITAL SIGNS:  Blood pressure 126/73, pulse 114, respirations 16, temperature    102, O2 saturation 97% on room air.   GENERAL APPEARANCE:  The patient appears well developed and well nourished,    appears to be very uncomfortable and nauseated.   HEENT:  Head normocephalic, atraumatic.  Eyes:  Conjunctivae are clear.  Lids    appear normal.  Ears and nose:  Hearing is grossly intact to voice.  External    examination of ears and nose unremarkable.  Mouth/Throat:  Surfaces of the    pharynx, palate, and tongue are pink, moist, and without lesions.     NECK:  Supple, nontender, symmetrical.   LYMPHATICS:  No cervical or submandibular lymphadenopathy palpated.     RESPIRATORY:  Lungs are clear to auscultation bilaterally.  No wheezes, rales    or rhonchi.  The patient is in no acute respiratory  distress.   CARDIOVASCULAR:  Tachycardic, regular rhythm.  No murmurs, rubs or gallops. DP    pulses 2+ and equal bilaterally.   No peripheral edema or significant    varicosities.     GASTROINTESTINAL:  Normal active bowel sounds in all 4 quadrants.  Abdomen is    soft, nondistended, tender with complaint of pain to palpation in right upper    quadrant.  The patient also reports mild tenderness in all other abdominal    regions.  No organomegaly is noted.   GENITOURINARY:  Positive cva tenderness, right side greater than left.   MUSCULOSKELETAL:  Stance and gait appear normal.      SKIN:  Warm and dry without rashes.     NEUROLOGICAL:  No focal neurologic deficits.   PSYCHIATRIC:  Oriented to time, place and person.  Mood and affect    appropriate.         CONTINUATION BY DR. Truddie Crumble:       INITIAL ASSESSMENT AND MANAGEMENT PLAN:      This is a new problem for this patient.        DIAGNOSTIC STUDIES:     The following were obtained:  Urinalysis shows 10 to 14 white blood cells,    trace leukocyte esterase, moderate blood.  Gallbladder ultrasound was normal.      Lipase was normal.  CMP normal with exception of AST of 75 and ALT of 116.     CBC shows white blood cell count 11.6.       EMERGENCY DEPARTMENT COURSE:   The patient was quite febrile upon initial arrival.  She received    antipyretics, also IV fluids and pain medicine, and she felt considerably    better.  Due to the patient's right upper quadrant pain and tenderness and her    elevated liver enzymes, a gallbladder ultrasound was obtained, which was    entirely normal.  The patient did have some blood in her urine and right-sided    cva tenderness, so a dry scan was obtained to ensure she did not have an    infected stone.  This again was normal.       DIAGNOSES:   Acute pyelonephritis.       DISPOSITION AND PLAN:   The patient discharged in stable condition.  She received IV ciprofloxacin in    the Emergency Department.  She will be discharged on oral ciprofloxacin and    pain medication, and advised to follow up with her primary care doctor in 2 to    3 days for recheck.  She was told to come back immediately for worsening    fever, increased pain or vomiting.           ___________________   Posey Pronto MD   Dictated By: Zachary George. Pernell Dupre, PA-C   cd   D:05/09/2010   T: 05/09/2010 86:57:84   [id number]

## 2011-02-05 NOTE — ED Provider Notes (Signed)
KNOWN ALLERGIES   Morphine Sulfate: - itching   Seasonal (Unconfirmed)   Shellfish       TRIAGE (Mon Feb 05, 2011 09:02 LDD0)   PATIENT: NAME: [name], AGE: 50, GENDER: female, DOB: Sat         1960-09-26, TIME OF GREET: Mon Feb 05, 2011 08:59, LANGUAGE:         [place], [place]: [id number], KG WEIGHT: 88.9 (est.), HEIGHT: 175cm,         MEDICAL RECORD NUMBER: [id number], ACCOUNT NUMBER: [id number], PCP:         Barry Dienes,. (Mon Feb 05, 2011 09:02 LDD0)   ADMISSION: URGENCY: 3, DEPT: Emergency, BED: WAITING. (Mon Feb 05, 2011 09:02 LDD0)   VITAL SIGNS: BP 112/82, (Sitting), Pulse 94, Resp 20, Temp 98.6,         (Oral), Pain 10, O2 Sat 96, on Room air, Time 02/05/2011 09:00. (09:00         LDD0)   COMPLAINT:  Cough/Sore Throat/Fever. (Mon Feb 05, 2011 09:02         LDD0)   PRESENTING COMPLAINT:  For 4-5 days patient complains of coughing         up pheglm brown/yellow in color, she states she had a fever of 102         and took aleeve and hour and half ago, she also stated she vomited         this am. (09:31 BJJ1)   PAIN: Patient complains of pain, Pain described as aching, On a         scale 0-10 patient rates pain as 10, headache occiptal, Pain is         constant. (09:31 BJJ1)      took aleeve. (09:31 BJJ1)   IMMUNIZATIONS:  Last tetanus shot received less than 5 years ago.         (09:31 BJJ1)   LMP: LMP: Menopause. (09:31 BJJ1)   TB SCREENING: TB screen negative for this patient. (09:31         BJJ1)   ABUSE SCREENING: Patient denies physical abuse or threats. (09:31         BJJ1)   FALL RISK: Patient has a low risk of falling, Patient has no         history of falling (0), No secondary diagnosis (0), None/bed         rest/nurse assist (0), No IV or IV access (0), Normal/bed         rest/wheelchair (0), Oriented to own ability (0), Total 0. (09:31         BJJ1)   SUICIDAL IDEATION: Suicidal ideation is not present. (09:31         BJJ1)   ADVANCE DIRECTIVES: Patient does not have advance directives,          Triage assessment performed. (09:31 BJJ1)   PROVIDERS: TRIAGE NURSE: Annamary Carolin, RN. (Mon Feb 05, 2011 09:02         LDD0)   PREVIOUS VISIT ALLERGIES: Morphine Sulfate - Itching. (Mon Feb 05, 2011 09:02 LDD0)       CURRENT MEDICATIONS (09:03 LDD0)   Patient not taking meds       MEDICATION SERVICE   DiphenhydrAMINE Hydrochloride:  Order: DiphenhydrAMINE         Hydrochloride (Diphenhydramine Hydrochloride) - Dose: 25 mg         :  IV         Ordered by: Zara Chess, PA-C         Entered by: Zara Chess, PA-C Mon Feb 05, 2011 09:52 ,          Acknowledged by: Lynnea Ferrier, RN Mon Feb 05, 2011 10:03         Documented as given by: Lynnea Ferrier, RN Mon Feb 05, 2011 10:28          Patient, Medication, Dose, Route and Time verified prior to         administration.          Time given: 1020, Amount given: 25mg /0.60ml, IV site 1, Medication         administered into left AC, Concentration confirmed prior to         administration, IVP, Initial medication, Slowly, Catheter placement         confirmed via flush prior to administration, IV site without signs or         symptoms of infiltration during medication administration, No         swelling during administration, No drainage during administration, IV         flushed after administration, Correct patient, time, route, dose and         medication confirmed prior to administration, Patient advised of         actions and side-effects prior to administration, Allergies confirmed         and medications reviewed prior to administration, Patient in position         of comfort, Side rails up, Cart in lowest position, Family at         bedside, Call light in reach.   Ketorolac Tromethamine:  Order: Ketorolac Tromethamine -         Dose: 30 mg : IV         Ordered by: Zara Chess, PA-C         Entered by: Zara Chess, PA-C Mon Feb 05, 2011 10:32 ,          Acknowledged by: Glenetta Borg, RN Mon Feb 05, 2011 10:38          Documented as given by: Glenetta Borg, RN Mon Feb 05, 2011 10:44          Patient, Medication, Dose, Route and Time verified prior to         administration.          Time given: 1043, Amount given: 30 mg, IV site 1, IVP, Initial         medication, Slowly, Catheter placement confirmed via flush prior to         administration, IV site without signs or symptoms of infiltration         during medication administration, No swelling during administration,         No drainage during administration, IV flushed after administration,         Correct patient, time, route, dose and medication confirmed prior to         administration, Patient advised of actions and side-effects prior to         administration, Allergies confirmed and medications reviewed prior to         administration.    : Follow Up : Time: 1100, Decreased pain, On a scale 0-10         patient rates pain as 0, No signs or symptoms of allergic reaction  noted, Advised not to ambulate without assistance, Patient in         position of comfort, Side rails up, Cart in lowest position, Family         at bedside, Call light in reach. (11:07 BJJ1)   PredniSONE:  Order: PredniSONE (Prednisone) - Dose: 60         mg : Oral         Ordered by: Zara Chess, PA-C         Entered by: Zara Chess, PA-C Mon Feb 05, 2011 09:52 ,          Acknowledged by: Lynnea Ferrier, RN Mon Feb 05, 2011 10:03         Documented as given by: Lynnea Ferrier, RN Mon Feb 05, 2011 10:28          Patient, Medication, Dose, Route and Time verified prior to         administration.          Time given: 1015, Amount given: 60mg , Site: Medication administered         P.O., Correct patient, time, route, dose and medication confirmed         prior to administration, Patient advised of actions and side-effects         prior to administration, Allergies confirmed and medications reviewed         prior to administration, Patient in position of comfort, Side rails          up, Cart in lowest position, Family at bedside, Call light in reach.   Prochlorperazine Edisylate:  Order: Prochlorperazine Edisylate -         Dose: 10 mg : IV         Ordered by: Zara Chess, PA-C         Entered by: Zara Chess, PA-C Mon Feb 05, 2011 09:52 ,          Acknowledged by: Lynnea Ferrier, RN Mon Feb 05, 2011 10:03         Documented as given by: Lynnea Ferrier, RN Mon Feb 05, 2011 10:29          Patient, Medication, Dose, Route and Time verified prior to         administration.          Time given: 1025, Amount given: 10mg /81ml NS, IV site 1, Medication         administered into left AC, Concentration confirmed prior to         administration, Drip/IVPB, IVPB mixed in: 50ml, Fluid: 0.9NS,         Infusion time 15 minutes, via secondary tubing, Catheter placement         confirmed via flush prior to administration, IV site without signs or         symptoms of infiltration during medication administration, No         swelling during administration, No drainage during administration,         Correct patient, time, route, dose and medication confirmed prior to         administration, Patient advised of actions and side-effects prior to         administration, Allergies confirmed and medications reviewed prior to         administration, Patient in position of comfort, Side rails up, Cart         in lowest position, Family at bedside, Call light in reach.  ORDERS   IV- Normal Saline 1 liter Bolus:  Ordered for: Wenda Overland, DO,         Jennifer         Status: Done by Laural Benes, RN, [facility] Feb 05, 2011 10:01. (09:52         [facility])   HAND HELD NEBULIZER:  Ordered for: Himmel Salch, DO, Jennifer         Status: Active. (09:52 [facility])   CHEST 2 VIEWS:  Ordered for: Himmel Salch, DO, Jennifer         Status: Active. (09:52 [facility])   Elita Boone IV Cath:  Ordered for: Himmel Salch, DO, Jennifer         Status: Active. (10:06 BJJ1)   BP Cuff Adult Regular:  Ordered for: Himmel Salch, DO, Jennifer          Status: Active. (10:06 BJJ1)   IV Set - Primary Tubing:  Ordered for: Himmel Salch, DO, Jennifer         Status: Active. (10:06 BJJ1)   IV Start kit:  Ordered for: Himmel Salch, DO, Jennifer         Status: Active. (10:06 BJJ1)   MDI INSTRUCTION:  Ordered for: Himmel Salch, DO, Jennifer         Status: Active. (11:44 [facility])       NURSING ASSESSMENT: RESPIRATORY /CHEST (09:32 BJJ1)   CONSTITUTIONAL: Patient arrives ambulatory, Gait steady, Patient         appears, restless, Patient cooperative, Patient alert, Oriented to         person, place and time, Skin warm, Skin dry, Skin normal in color,         Mucous membranes pink, Mucous membranes moist, Patient is         well-groomed, Patient complains of For 4-5 days patient complains of         coughing up pheglm brown/yellow in color, she states she had a fever         of 102 and took aleeve and hour and half ago, she also stated she         vomited this am.   PAIN: aching pain, on a scale 0-10 patient rates pain as 10,         Headache.   RESPIRATORY/CHEST: Respiratory assessment findings include         respiratory effort easy, Respirations regular, Conversing normally,         Breath sounds with wheezing, anteriorally, posteriorally, Associated         with cough, productive of, brown sputum, and yellow, Notes: Patient         states she vomited after coughing this AM.   SAFETY: Side rails up, Cart/Stretcher in lowest position, Call         light within reach, Hospital ID band on.       NURSING PROCEDURE: DISCHARGE NOTE (11:57 BJJ1)   DISCHARGE: Patient discharged to home, ambulating without         assistance, family driving, accompanied by other family member,         Discharge instructions given to patient, IV discontinued at 1158, IV         Fluids started at 1000, IV Fluids discontinued at 1158, Simple or         moderate discharge teaching performed, by B.Johnson, Prescriptions         given and instructions on side effects given, Name of  prescription(s)  given: Phenergan Tablet : 25 mg : Oral : Quantity: *** 1 *** Unit:         tab(s) Route: Oral Schedule: every 4 to 6 hours Dispense: *** 10         ***., Above person(s) verbalized understanding of discharge         instructions and follow-up care, Patient treated and evaluated by         physician.   BELONGINGS: Belongings and valuables with patient at time of         discharge include:, Belongings remain with patient, Valuables remain         with patient.       NURSING PROCEDURE: IV (10:00 BJJ1)   PATIENT IDENITIFIER: Patient's identity verified by patient         stating name, Patient's identity verified by patient stating birth         date, Patient's identity verified by hospital ID bracelet, Patient         actively involved in identification process.   IV SITE 1: IV established, to the left antecubital, using a 22         gauge catheter, in one attempt.   FOLLOW-UP SITE 1: IV Fluids started at 1000.   FLUIDS: 0.9 normal saline 1 liter hung, first bag, IV bolus of         1000 ml established, at a wide open rate, via primary tubing.   SAFETY: Side rails up, Cart/Stretcher in lowest position, Family         at bedside, Call light within reach, Hospital ID band on.       NURSING PROCEDURE: TRANSPORT TO TESTS   PATIENT IDENTIFIER: Patient's identity verified by patient         stating name, Patient's identity verified by patient stating birth         date, Patient's identity verified by hospital ID bracelet. (10:15         TNF)   TRANSPORT TO TESTS: Patient transported to x-ray, via cart,         Accompanied by Engineer, water, Hand-off report received from         [place], Charity fundraiser, [employer]. (10:15 TNF)   FOLLOW-UP: After procedure, patient returned to emergency         department, Hand-off report was given to [facility], RN, [place]. (10:26         TNF)   SAFETY: Side rails up, Cart/Stretcher in lowest position, Call         light within reach, Hospital ID band on. (10:26 TNF)        DIAGNOSIS (11:47 AM[facility])   FINAL: PRIMARY: acute bronchitis, ADDITIONAL: acute cephalgia,         improved.       DISPOSITION   PATIENT:  Disposition Type: Discharged, Disposition: Discharged,         Condition: Stable. (11:47 [facility])      IV Infusion: Start and stop time entered for IV fluid, Patient left the         department. (12:07 BJJ1)       INSTRUCTION (11:46 AM[facility])   DISCHARGE:  BRONCHITIS - WITH INHALER (VIRAL BRONCHIAL         INFECTION), HEADACHE (CEPHALGIA).   SPECIAL:  Follow up with primary care physician.         Return to ER for any worsening or new concerns.         Do not drive or operate  machinery while medicated Phenergan and/or         Robitussin AC they may make you drowsy.         Use your inhaler every 4 hours until symptoms improve then use as         needed.   Key:     AMC=Conrad, PA-C, Angela  BJJ1=Johnson, RN, Karen Kitchens  LDD0=David, RN, Lyn     TNF=[place], TRANSPORT, Maisie Fus ([place])

## 2011-02-05 NOTE — ED Provider Notes (Signed)
[facility]   EMERGENCY DEPARTMENT TREATMENT REPORT   NAME:  [name], [name]   SEX:   F   ADMIT: 02/05/2011   DOB:   04-Apr-1961   MR#    [id number]   ROOM:     TIME SEEN: 09 57 AM   ACCT#  [id number]               CHIEF COMPLAINT:   Cough, sore throat, fever.       HISTORY OF PRESENT ILLNESS:   This is a 50 year old female who states that she has been having a 3 to 4-day    history of cough productive of clear  sometimes brown sputum.  She states    that the brown sputum has been intermittent and usually clears by the end of    her coughing spells.  She states that she has also felt a little short of    breath.  She is a previous smoker, just stopped smoking about 2 months ago.     Felt like she had a temperature yesterday, but did not check her temperature    and this morning she checked it and it was 102 .  She took some Aleve and on    arrival, her temperature was 98.6.  She mentioned that she had a headache    gradual onset of a headache yesterday afternoon that was about a 6 out of 10.     It seems it gotten progressively worse throughout the evening.  She tried    taking Aleve with no change or improvement of her symptoms.  This morning she    got up and the headache remained it started worsening and is now a 10 out of    10  pain occipital, which feels like a sharp  constant pain and an aching    behind her eyes.  She got up this morning, took a shower, and she said she did    have a coughing spell in the shower and had an episode of posttussive emesis.       REVIEW OF SYSTEMS:   CONSTITUTIONAL:  Positive for fever.    EYES:  No visual symptoms.    ENT:  The patient states that she throat feels sore and raw.     RESPIRATORY:  Cough, shortness of breath.   CARDIOVASCULAR:  Denies any chest pain.   GASTROINTESTINAL:  No abdominal pain and patient states that she has some very    mild nausea, one episode of vomiting this morning.   NEUROLOGIC:  Positive for headache.    MUSCULOSKELETAL:  No joint pain or swelling.     INTEGUMENTARY:  No rashes.        PAST MEDICAL HISTORY:   Pancreatitis, diverticulitis.       SOCIAL HISTORY:   Previous smoker.       MEDICATIONS:     None.         ALLERGIES:   MORPHINE, SHELLFISH.       PHYSICAL EXAMINATION:   VITAL SIGNS:  Blood pressure 112/82, pulse 94, respirations 20, temperature    98.6, pulse oximetry 96% on room air, pain 10 out of 10.   GENERAL APPEARANCE:  This is a well appearing, 50 year old, lying on the    stretcher in a dark room.  Upon turning on the lights, she is able to sit up    and appears comfortable, but stating 10 out of 10 pain.     HEENT: Eyes:  Conjunctivae clear, lids normal.  Pupils equal, symmetrical, and    normally reactive. Ears/Nose:  Hearing is grossly intact to voice.  Internal    and external examinations of the ears and nose are unremarkable.     Mouth/Throat:  Surface of the pharynx, palate, and tongue are pink, moist, and    without lesions.      NECK:  Supple, nontender, symmetrical, no masses or JVD, trachea midline,    thyroid not enlarged, nodular, or tender.  No cervical or submandibular    lymphadenopathy palpated.     No meningeal findings.  No neck  stiffness noted.  No nuchal    rigidity.   RESPIRATORY:  Nontachypneic on exam.  The patient has some expiratory wheezing    throughout all lobes.  No rales or rhonchi.  The patient does have diminished    sounds at the bases.   CARDIOVASCULAR:  Heart regular, without murmurs, gallops, rubs, or thrills.     PMI not displaced.      GI:  Abdomen soft, nontender, without complaints of pain to palpation.  No    hepatomegaly or splenomegaly.    MUSCULOSKELETAL:  Stance and gait appear normal.    SKIN:  Warm and dry without rashes.    PSYCHIATRIC:  Judgment appears appropriate.         INITIAL ASSESSMENT AND MANAGEMENT PLAN:      A 50 year old with complaints of a 3 to 4-day history of a fever, cough and     also started yesterday with a gradual onset of a headache.  We will go ahead    and treat her symptoms with IV Benadryl and Compazine for headache and nausea.    We will also give her a neb treatment for her wheezing and shortness of    breath, check a chest x-ray to rule out pneumonia, and she will be given 60 mg    of prednisone p.o.       CONTINUED BY ANGELA E. CONRAD, PA-C:        DIAGNOSTIC STUDIES:      Chest x-ray shows no acute cardiopulmonary process as read by Dr. Liane Comber.       EMERGENCY DEPARTMENT COURSE:   IV access was obtained.  The patient was given IV Benadryl 25 mg,    prochlorperazine 10 mg IV and prednisone 60 mg p.o.  After reevaluation, the    patient still had headache, did not notice much improvement, her nausea was    resolved, so I then gave her 30 mg of Toradol IV.  On reevaluation at 11:40    patient was sitting up and said that she felt much improved.  Her headache    from 10 out of 10 is now 4 out of 10 after receiving a nebulizer of albuterol    and Atrovent.  She is improved as well with her cough and her breathing.  The    patient was given an MDI with spacer and instructions prior to discharge.       DIAGNOSES:   1.  Acute bronchitis.   2.  Acute cephalgia, improved.       DISPOSITION:   The patient is given a prescription for Robitussin-AC and Phenergan for    nausea.  Told to followup with her primary care physician and return here if    any worsening or new concerns.  The patient was personally evaluated by myself    and Dr. Wenda Overland who agrees with the  above assessment and plan.   The    patient is given a prescription for prednisone for the next couple of days as    well.           ___________________   Liberty Handy Himmel-Salch DO   Dictated By: Wynona Luna. Renata Caprice, PA-C   TC   D:02/05/2011   T: 02/06/2011 81:19:14   [id number]

## 2011-03-06 ENCOUNTER — Other Ambulatory Visit: Payer: Self-pay | Admitting: Gynecology

## 2011-03-06 DIAGNOSIS — R928 Other abnormal and inconclusive findings on diagnostic imaging of breast: Secondary | ICD-10-CM

## 2011-03-28 NOTE — Patient Instructions (Addendum)
   Your procedure is scheduled [id number]  Enter through the Main Entrance of [facility] at: 6 am Pick up the phone at the desk and dial [phone]  Please call this number if you have any problems the morning of surgery: [phone]  Remember: Do not eat food after midnight  Do not drink clear liquids after:midnight Take these medicines the morning of surgery with a SIP OF WATER:none  Do not wear jewelry, make-up, or FINGER nail polish Do not wear lotions, powders, or perfumes.no deodorants Do not shave 48 hours prior to surgery. Do not bring valuables to the hospital. Leave suitcase in the car. After Surgery it may be brought to your room. For patients being admitted to the hospital, checkout time is 11:00am the day of discharge.     Remember to use your hibiclens as instructed.

## 2011-03-30 ENCOUNTER — Ambulatory Visit: Payer: BC Managed Care – PPO

## 2011-04-02 ENCOUNTER — Other Ambulatory Visit: Payer: Self-pay | Admitting: Obstetrics and Gynecology

## 2011-04-02 ENCOUNTER — Encounter (HOSPITAL_COMMUNITY): Payer: Self-pay

## 2011-04-02 ENCOUNTER — Other Ambulatory Visit: Payer: Self-pay

## 2011-04-02 ENCOUNTER — Encounter (HOSPITAL_COMMUNITY)
Admission: RE | Admit: 2011-04-02 | Discharge: 2011-04-02 | Disposition: A | Discharge status: Home / Self Care | Payer: BC Managed Care – PPO | Source: Ambulatory Visit | Attending: Obstetrics and Gynecology | Admitting: Obstetrics and Gynecology

## 2011-04-02 HISTORY — DX: Shortness of breath: R06.02

## 2011-04-02 HISTORY — DX: Personal history of other specified conditions: Z87.898

## 2011-04-02 HISTORY — DX: Gastro-esophageal reflux disease without esophagitis: K21.9

## 2011-04-02 HISTORY — DX: Reserved for concepts with insufficient information to code with codable children: IMO0002

## 2011-04-02 LAB — CBC
MCH: 32.4 pg (ref 26.0–34.0)
MCHC: 34 g/dL (ref 30.0–36.0)
MCV: 95.6 fL (ref 78.0–100.0)
Platelets: 225 10*3/uL (ref 150–400)
RDW: 13.4 % (ref 11.5–15.5)

## 2011-04-02 LAB — SURGICAL PCR SCREEN: Staphylococcus aureus: NEGATIVE

## 2011-04-02 NOTE — Pre-Procedure Instructions (Signed)
Updated Dr. Nancee Liter history of chest pain-requested Stress test-pt was told muscular and released. EKG done today. 7:30 am case. LSD ok'd

## 2011-04-03 ENCOUNTER — Other Ambulatory Visit: Payer: Self-pay | Admitting: Obstetrics and Gynecology
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
## 2011-04-10 ENCOUNTER — Inpatient Hospital Stay (HOSPITAL_COMMUNITY): Payer: BC Managed Care – PPO | Admitting: Anesthesiology

## 2011-04-10 ENCOUNTER — Encounter (HOSPITAL_COMMUNITY): Payer: Self-pay | Admitting: *Deleted

## 2011-04-10 ENCOUNTER — Other Ambulatory Visit: Payer: Self-pay | Admitting: Obstetrics and Gynecology

## 2011-04-10 ENCOUNTER — Encounter (HOSPITAL_COMMUNITY)
Admission: RE | Disposition: A | Discharge status: Home / Self Care | Payer: Self-pay | Source: Ambulatory Visit | Attending: Obstetrics and Gynecology

## 2011-04-10 ENCOUNTER — Encounter (HOSPITAL_COMMUNITY): Admission: RE | Payer: Self-pay | Source: Ambulatory Visit

## 2011-04-10 ENCOUNTER — Inpatient Hospital Stay (HOSPITAL_COMMUNITY)
Admission: RE | Admit: 2011-04-10 | Payer: BC Managed Care – PPO | Source: Ambulatory Visit | Admitting: Obstetrics and Gynecology

## 2011-04-10 ENCOUNTER — Ambulatory Visit (HOSPITAL_COMMUNITY)
Admission: RE | Admit: 2011-04-10 | Discharge: 2011-04-11 | Disposition: A | Discharge status: Home / Self Care | Payer: BC Managed Care – PPO | Source: Ambulatory Visit | Attending: Obstetrics and Gynecology | Admitting: Obstetrics and Gynecology

## 2011-04-10 ENCOUNTER — Encounter (HOSPITAL_COMMUNITY): Payer: Self-pay | Admitting: Anesthesiology

## 2011-04-10 DIAGNOSIS — Z01812 Encounter for preprocedural laboratory examination: Secondary | ICD-10-CM | POA: Insufficient documentation

## 2011-04-10 DIAGNOSIS — N8000 Endometriosis of the uterus, unspecified: Secondary | ICD-10-CM | POA: Insufficient documentation

## 2011-04-10 DIAGNOSIS — D251 Intramural leiomyoma of uterus: Secondary | ICD-10-CM | POA: Insufficient documentation

## 2011-04-10 DIAGNOSIS — N838 Other noninflammatory disorders of ovary, fallopian tube and broad ligament: Secondary | ICD-10-CM | POA: Insufficient documentation

## 2011-04-10 DIAGNOSIS — N8111 Cystocele, midline: Secondary | ICD-10-CM | POA: Insufficient documentation

## 2011-04-10 DIAGNOSIS — Z01818 Encounter for other preprocedural examination: Secondary | ICD-10-CM | POA: Insufficient documentation

## 2011-04-10 DIAGNOSIS — N831 Corpus luteum cyst of ovary, unspecified side: Secondary | ICD-10-CM | POA: Insufficient documentation

## 2011-04-10 HISTORY — PX: ABDOMINAL HYSTERECTOMY: SHX81

## 2011-04-10 HISTORY — PX: LAPAROSCOPIC ASSISTED VAGINAL HYSTERECTOMY: SHX5398

## 2011-04-10 HISTORY — PX: SALPINGOOPHORECTOMY: SHX82

## 2011-04-10 HISTORY — PX: ANTERIOR AND POSTERIOR REPAIR: SHX5121

## 2011-04-10 SURGERY — HYSTERECTOMY, VAGINAL, LAPAROSCOPY-ASSISTED
Anesthesia: General | Site: Vagina | Wound class: Clean Contaminated

## 2011-04-10 SURGERY — HYSTERECTOMY, VAGINAL, LAPAROSCOPY-ASSISTED
Anesthesia: General

## 2011-04-10 MED ORDER — ESTRADIOL 0.1 MG/GM VA CREA
TOPICAL_CREAM | VAGINAL | Status: DC | PRN
  Administered 2011-04-10: 1 via VAGINAL

## 2011-04-10 MED ORDER — LACTATED RINGERS IV SOLN
INTRAVENOUS | Status: DC
  Administered 2011-04-10 – 2011-04-11 (×2): via INTRAVENOUS

## 2011-04-10 MED ORDER — KETOROLAC TROMETHAMINE 30 MG/ML IJ SOLN
INTRAMUSCULAR | Status: AC
  Filled 2011-04-10: qty 1

## 2011-04-10 MED ORDER — GLYCOPYRROLATE 0.2 MG/ML IJ SOLN
INTRAMUSCULAR | Status: DC | PRN
  Administered 2011-04-10: 0.2 mg via INTRAVENOUS
  Administered 2011-04-10: .6 mg via INTRAVENOUS

## 2011-04-10 MED ORDER — LACTATED RINGERS IV SOLN
INTRAVENOUS | Status: DC
  Administered 2011-04-10 (×3): via INTRAVENOUS

## 2011-04-10 MED ORDER — KETOROLAC TROMETHAMINE 30 MG/ML IJ SOLN
30.0000 mg | Freq: Four times a day (QID) | INTRAMUSCULAR | Status: DC
  Administered 2011-04-11 (×2): 30 mg via INTRAVENOUS
  Filled 2011-04-10 (×2): qty 1

## 2011-04-10 MED ORDER — OXYCODONE-ACETAMINOPHEN 5-325 MG PO TABS
1.0000 | ORAL_TABLET | ORAL | Status: DC | PRN
  Administered 2011-04-11 (×2): 2 via ORAL
  Filled 2011-04-10 (×2): qty 2

## 2011-04-10 MED ORDER — DEXAMETHASONE SODIUM PHOSPHATE 10 MG/ML IJ SOLN
INTRAMUSCULAR | Status: AC
  Filled 2011-04-10: qty 1

## 2011-04-10 MED ORDER — KETOROLAC TROMETHAMINE 30 MG/ML IJ SOLN: 15.0000 mg | Freq: Once | INTRAMUSCULAR | Status: DC | PRN

## 2011-04-10 MED ORDER — GLYCOPYRROLATE 0.2 MG/ML IJ SOLN
INTRAMUSCULAR | Status: AC
  Filled 2011-04-10: qty 2

## 2011-04-10 MED ORDER — MIDAZOLAM HCL 5 MG/5ML IJ SOLN
INTRAMUSCULAR | Status: DC | PRN
  Administered 2011-04-10: 2 mg via INTRAVENOUS

## 2011-04-10 MED ORDER — DIPHENHYDRAMINE HCL 50 MG/ML IJ SOLN: 12.5000 mg | Freq: Four times a day (QID) | INTRAMUSCULAR | Status: DC | PRN

## 2011-04-10 MED ORDER — HYDROMORPHONE HCL 1 MG/ML IJ SOLN
INTRAMUSCULAR | Status: AC
  Filled 2011-04-10: qty 1

## 2011-04-10 MED ORDER — PROPOFOL 10 MG/ML IV EMUL
INTRAVENOUS | Status: AC
  Filled 2011-04-10: qty 20

## 2011-04-10 MED ORDER — SODIUM CHLORIDE 0.9 % IJ SOLN
INTRAMUSCULAR | Status: DC | PRN
  Administered 2011-04-10: 10 mL

## 2011-04-10 MED ORDER — ONDANSETRON HCL 4 MG/2ML IJ SOLN
INTRAMUSCULAR | Status: DC | PRN
  Administered 2011-04-10: 4 mg via INTRAVENOUS

## 2011-04-10 MED ORDER — HYDROMORPHONE 0.3 MG/ML IV SOLN
INTRAVENOUS | Status: DC
  Administered 2011-04-10: 4.67 mg via INTRAVENOUS
  Administered 2011-04-10: 12:00:00 via INTRAVENOUS
  Administered 2011-04-10: 2.39 mg via INTRAVENOUS
  Administered 2011-04-10: 5.33 mg via INTRAVENOUS
  Administered 2011-04-11: 135 mg via INTRAVENOUS
  Administered 2011-04-11: 1.99 mg via INTRAVENOUS
  Administered 2011-04-11: 1.19 mg via INTRAVENOUS

## 2011-04-10 MED ORDER — ROCURONIUM BROMIDE 100 MG/10ML IV SOLN
INTRAVENOUS | Status: DC | PRN
  Administered 2011-04-10: 40 mg via INTRAVENOUS

## 2011-04-10 MED ORDER — HYDROMORPHONE 0.3 MG/ML IV SOLN
INTRAVENOUS | Status: AC
  Filled 2011-04-10: qty 25

## 2011-04-10 MED ORDER — NEOSTIGMINE METHYLSULFATE 1 MG/ML IJ SOLN
INTRAMUSCULAR | Status: DC | PRN
  Administered 2011-04-10: 1.5 mg via INTRAMUSCULAR

## 2011-04-10 MED ORDER — SODIUM CHLORIDE 0.9 % IJ SOLN: 9.0000 mL | INTRAMUSCULAR | Status: DC | PRN

## 2011-04-10 MED ORDER — BUPIVACAINE HCL 0.5 % IJ SOLN
INTRAMUSCULAR | Status: DC | PRN
  Administered 2011-04-10: 30 mL

## 2011-04-10 MED ORDER — ROCURONIUM BROMIDE 50 MG/5ML IV SOLN
INTRAVENOUS | Status: AC
  Filled 2011-04-10: qty 1

## 2011-04-10 MED ORDER — NEOSTIGMINE METHYLSULFATE 1 MG/ML IJ SOLN
INTRAMUSCULAR | Status: AC
  Filled 2011-04-10: qty 10

## 2011-04-10 MED ORDER — LIDOCAINE HCL (CARDIAC) 20 MG/ML IV SOLN
INTRAVENOUS | Status: DC | PRN
  Administered 2011-04-10: 80 mg via INTRAVENOUS

## 2011-04-10 MED ORDER — FENTANYL CITRATE 0.05 MG/ML IJ SOLN
INTRAMUSCULAR | Status: DC | PRN
  Administered 2011-04-10: 100 ug via INTRAVENOUS
  Administered 2011-04-10: 50 ug via INTRAVENOUS
  Administered 2011-04-10 (×2): 100 ug via INTRAVENOUS

## 2011-04-10 MED ORDER — FENTANYL CITRATE 0.05 MG/ML IJ SOLN
INTRAMUSCULAR | Status: AC
  Filled 2011-04-10: qty 5

## 2011-04-10 MED ORDER — CEFAZOLIN SODIUM 1-5 GM-% IV SOLN
INTRAVENOUS | Status: AC
  Administered 2011-04-10: 1 g via INTRAVENOUS
  Filled 2011-04-10: qty 50

## 2011-04-10 MED ORDER — MIDAZOLAM HCL 2 MG/2ML IJ SOLN
INTRAMUSCULAR | Status: AC
  Filled 2011-04-10: qty 2

## 2011-04-10 MED ORDER — GLYCOPYRROLATE 0.2 MG/ML IJ SOLN
INTRAMUSCULAR | Status: AC
  Filled 2011-04-10: qty 1

## 2011-04-10 MED ORDER — LIDOCAINE-EPINEPHRINE 0.5-1:200000 % IJ SOLN
INTRAMUSCULAR | Status: DC | PRN
  Administered 2011-04-10: 17 mL

## 2011-04-10 MED ORDER — PROPOFOL 10 MG/ML IV EMUL
INTRAVENOUS | Status: DC | PRN
  Administered 2011-04-10: 150 mg via INTRAVENOUS

## 2011-04-10 MED ORDER — ONDANSETRON HCL 4 MG/2ML IJ SOLN
INTRAMUSCULAR | Status: AC
  Filled 2011-04-10: qty 2

## 2011-04-10 MED ORDER — ONDANSETRON HCL 4 MG/2ML IJ SOLN
4.0000 mg | Freq: Four times a day (QID) | INTRAMUSCULAR | Status: DC | PRN
  Administered 2011-04-10 – 2011-04-11 (×2): 4 mg via INTRAVENOUS
  Filled 2011-04-10 (×2): qty 2

## 2011-04-10 MED ORDER — NALOXONE HCL 0.4 MG/ML IJ SOLN: 0.4000 mg | INTRAMUSCULAR | Status: DC | PRN

## 2011-04-10 MED ORDER — DIPHENHYDRAMINE HCL 12.5 MG/5ML PO ELIX: 12.5000 mg | ORAL_SOLUTION | Freq: Four times a day (QID) | ORAL | Status: DC | PRN

## 2011-04-10 MED ORDER — HYDROMORPHONE HCL 1 MG/ML IJ SOLN
0.2500 mg | INTRAMUSCULAR | Status: DC | PRN
  Administered 2011-04-10 (×2): 0.5 mg via INTRAVENOUS

## 2011-04-10 MED ORDER — KETOROLAC TROMETHAMINE 30 MG/ML IJ SOLN
INTRAMUSCULAR | Status: DC | PRN
  Administered 2011-04-10: 30 mg via INTRAVENOUS

## 2011-04-10 MED ORDER — INDIGOTINDISULFONATE SODIUM 8 MG/ML IJ SOLN
INTRAMUSCULAR | Status: DC | PRN
  Administered 2011-04-10: 5 mg via INTRAVENOUS

## 2011-04-10 MED ORDER — LIDOCAINE HCL (CARDIAC) 20 MG/ML IV SOLN
INTRAVENOUS | Status: AC
  Filled 2011-04-10: qty 5

## 2011-04-10 MED ORDER — LACTATED RINGERS IR SOLN
Status: DC | PRN
  Administered 2011-04-10: 3000 mL

## 2011-04-10 MED ORDER — IBUPROFEN 600 MG PO TABS: 600.0000 mg | ORAL_TABLET | Freq: Four times a day (QID) | ORAL | Status: DC | PRN

## 2011-04-10 MED ORDER — CEFAZOLIN SODIUM 1-5 GM-% IV SOLN: 1.0000 g | Freq: Once | INTRAVENOUS | Status: DC

## 2011-04-10 MED ORDER — STERILE WATER FOR IRRIGATION IR SOLN
Status: DC | PRN
  Administered 2011-04-10: 1000 mL via INTRAVESICAL

## 2011-04-10 SURGICAL SUPPLY — 53 items
ADH SKN CLS APL DERMABOND .7 (GAUZE/BANDAGES/DRESSINGS) ×3
BLADE SURG 11 STRL SS (BLADE) ×4 IMPLANT
BLADE SURG 15 STRL LF C SS BP (BLADE) ×3 IMPLANT
BLADE SURG 15 STRL SS (BLADE) ×4
CATH ROBINSON RED A/P 16FR (CATHETERS) ×2 IMPLANT
CLOTH BEACON ORANGE TIMEOUT ST (SAFETY) ×4 IMPLANT
CONT PATH 16OZ SNAP LID 3702 (MISCELLANEOUS) ×4 IMPLANT
COVER TABLE BACK 60X90 (DRAPES) ×4 IMPLANT
DECANTER SPIKE VIAL GLASS SM (MISCELLANEOUS) ×1 IMPLANT
DERMABOND ADVANCED (GAUZE/BANDAGES/DRESSINGS) ×1
DERMABOND ADVANCED .7 DNX12 (GAUZE/BANDAGES/DRESSINGS) ×1 IMPLANT
DEVICE CAPIO SUTURING (INSTRUMENTS) ×1
DEVICE CAPIO SUTURING OPC (INSTRUMENTS) IMPLANT
DRSG COVADERM PLUS 2X2 (GAUZE/BANDAGES/DRESSINGS) ×1 IMPLANT
ELECT REM PT RETURN 9FT ADLT (ELECTROSURGICAL) ×4
ELECTRODE REM PT RTRN 9FT ADLT (ELECTROSURGICAL) IMPLANT
GAUZE PACKING 1 X5 YD ST (GAUZE/BANDAGES/DRESSINGS) ×2 IMPLANT
GAUZE SPONGE 4X4 16PLY XRAY LF (GAUZE/BANDAGES/DRESSINGS) ×5 IMPLANT
GLOVE BIO SURGEON STRL SZ8 (GLOVE) ×12 IMPLANT
GLOVE BIOGEL PI IND STRL 6.5 (GLOVE) ×3 IMPLANT
GLOVE BIOGEL PI INDICATOR 6.5 (GLOVE) ×1
GOWN PREVENTION PLUS LG XLONG (DISPOSABLE) ×12 IMPLANT
GOWN STRL REIN XL XLG (GOWN DISPOSABLE) ×4 IMPLANT
NDL INSUFFLATION 14GA 120MM (NEEDLE) ×2 IMPLANT
NEEDLE HYPO 22GX1.5 SAFETY (NEEDLE) IMPLANT
NEEDLE INSUFFLATION 14GA 120MM (NEEDLE) ×4 IMPLANT
NEEDLE MAYO .5 CIRCLE (NEEDLE) ×3 IMPLANT
NS IRRIG 1000ML POUR BTL (IV SOLUTION) ×4 IMPLANT
PACK LAVH (CUSTOM PROCEDURE TRAY) ×4 IMPLANT
SEALER TISSUE G2 CVD JAW 45CM (ENDOMECHANICALS) ×4 IMPLANT
SEALER TISSUE X1 CVD JAW (INSTRUMENTS) ×2 IMPLANT
SET CYSTO W/LG BORE CLAMP LF (SET/KITS/TRAYS/PACK) ×1 IMPLANT
SET IRRIG TUBING LAPAROSCOPIC (IRRIGATION / IRRIGATOR) IMPLANT
SPONGE GAUZE 2X2 8PLY STRL LF (GAUZE/BANDAGES/DRESSINGS) ×1 IMPLANT
SUT CAPIO POLYGLYCOLIC (SUTURE) ×2 IMPLANT
SUT MNCRL 0 MO-4 VIOLET 18 CR (SUTURE) ×9 IMPLANT
SUT MNCRL 0 VIOLET 6X18 (SUTURE) ×3 IMPLANT
SUT MNCRL AB 0 CT1 27 (SUTURE) ×2 IMPLANT
SUT MON AB 2-0 CT1 27 (SUTURE) ×8 IMPLANT
SUT MONOCRYL 0 6X18 (SUTURE) ×1
SUT MONOCRYL 0 MO 4 18  CR/8 (SUTURE) ×3
SUT VIC AB 2-0 CT2 27 (SUTURE) ×13 IMPLANT
SUT VIC AB 2-0 UR6 27 (SUTURE) ×4 IMPLANT
SUT VIC AB 3-0 PS2 18 (SUTURE) ×4
SUT VIC AB 3-0 PS2 18XBRD (SUTURE) ×1 IMPLANT
SUT VICRYL 0 UR6 27IN ABS (SUTURE) ×4 IMPLANT
SYR 30ML LL (SYRINGE) ×4 IMPLANT
TOWEL OR 17X24 6PK STRL BLUE (TOWEL DISPOSABLE) ×10 IMPLANT
TRAY FOLEY CATH 14FR (SET/KITS/TRAYS/PACK) ×4 IMPLANT
TROCAR XCEL NON-BLD 11X100MML (ENDOMECHANICALS) ×4 IMPLANT
TROCAR XCEL NON-BLD 5MMX100MML (ENDOMECHANICALS) ×4 IMPLANT
WARMER LAPAROSCOPE (MISCELLANEOUS) ×4 IMPLANT
WATER STERILE IRR 1000ML POUR (IV SOLUTION) ×4 IMPLANT

## 2011-04-10 NOTE — Progress Notes (Signed)
No changes to H&P per pt hx 

## 2011-04-10 NOTE — Anesthesia Postprocedure Evaluation (Signed)
Anesthesia Post Note  Patient: Courtney Villarreal  Procedure(s) Performed:  LAPAROSCOPIC ASSISTED VAGINAL HYSTERECTOMY; SALPINGO OOPHERECTOMY; ANTERIOR (CYSTOCELE) AND POSTERIOR REPAIR (RECTOCELE) - Sacrospinous Ligament Suspension  Anesthesia type: General  Patient location: PACU  Post pain: Pain level controlled  Post assessment: Post-op Vital signs reviewed  Last Vitals:  Filed Vitals:   04/10/11 1000  BP: 120/77  Pulse: 76  Temp:   Resp: 16    Post vital signs: Reviewed  Level of consciousness: sedated  Complications: No apparent anesthesia complicationsfj

## 2011-04-10 NOTE — Transfer of Care (Signed)
Immediate Anesthesia Transfer of Care Note  Patient: Courtney Villarreal  Procedure(s) Performed:  LAPAROSCOPIC ASSISTED VAGINAL HYSTERECTOMY; SALPINGO OOPHERECTOMY; ANTERIOR (CYSTOCELE) AND POSTERIOR REPAIR (RECTOCELE) - Sacrospinous Ligament Suspension  Patient Location: PACU  Anesthesia Type: General  Level of Consciousness: awake, alert  and oriented  Airway & Oxygen Therapy: Patient Spontanous Breathing and Patient connected to nasal cannula oxygen  Post-op Assessment: Report given to PACU RN and Post -op Vital signs reviewed and stable  Post vital signs: Reviewed and stable  Complications: No apparent anesthesia complications

## 2011-04-10 NOTE — Brief Op Note (Signed)
04/10/2011  9:33 AM  PATIENT:  Courtney Villarreal  50 y.o. female with uterine leiomyomata and symptomatic pelvic relaxation.  PRE-OPERATIVE DIAGNOSIS:  Pelvic Relaxation & Uterine Englargement  POST-OPERATIVE DIAGNOSIS:  Pelvic Relaxation & Uterine Englargement  PROCEDURE:  Procedure(s): LAPAROSCOPIC ASSISTED VAGINAL HYSTERECTOMY SALPINGO OOPHERECTOMY ANTERIOR (CYSTOCELE) AND POSTERIOR Colporrhaphy  (RECTOCELE) and Sacrospinous Ligament Suspension  SURGEON:  Harold Hedge  PHYSICIAN ASSISTANT: John Mccomb  ASSISTANTS: none   ANESTHESIA:   general  OR FLUID I/O:  Total I/O In: 1000 [I.V.:1000] Out: 230 [Urine:30; Blood:200]  BLOOD ADMINISTERED:none  DRAINS: Urinary Catheter (Foley) and Vaginal Pack in place   LOCAL MEDICATIONS USED:  MARCAINE 30CC and XYLOCAINE approx 20CC of .5% lidocaine with epinephrine Diluted    SPECIMEN:  Source of Specimen:  Uterus and bialteral tubes/ovaries  DISPOSITION OF SPECIMEN:  PATHOLOGY  COUNTS:  YES  TOURNIQUET:  * No tourniquets in log *  DICTATION: .Other Dictation: Dictation Number [id number]  PLAN OF CARE: Admit to inpatient   PATIENT DISPOSITION:  PACU - hemodynamically stable.   Delay start of Pharmacological VTE agent (>24hrs) due to surgical blood loss or risk of bleeding:  not applicable

## 2011-04-10 NOTE — Progress Notes (Signed)
Some nausea, good pain relief VSS Afeb Lungs CTA Cor RRR Abd soft, BS +  Satisfactory Toradol 30mg , IV q6hrs x 3

## 2011-04-10 NOTE — Plan of Care (Signed)
Problem: Phase I Progression Outcomes Goal: IS, TCDB as ordered Outcome: Completed/Met Date Met:  04/10/11 2000

## 2011-04-10 NOTE — Op Note (Signed)
Courtney Villarreal, Courtney Villarreal                 ACCOUNT NO.:  [id number]  MEDICAL RECORD NO.:  [id number]  LOCATION:  [facility]                          FACILITY:  [facility]  PHYSICIAN:  Guy Sandifer. Henderson Cloud, M.D. DATE OF BIRTH:  [birth year]  DATE OF PROCEDURE:  04/10/2011 DATE OF DISCHARGE:                              OPERATIVE REPORT   PREOPERATIVE DIAGNOSES: 1. Uterine leiomyomata. 2. Symptomatic pelvic relaxation.  POSTOPERATIVE DIAGNOSES: 1. Uterine leiomyomata. 2. Symptomatic pelvic relaxation.  PROCEDURE:  Laparoscopically-assisted vaginal hysterectomy, bilateral salpingo-oophorectomy, anterior colporrhaphy, posterior colporrhaphy, and sacrospinous ligament suspension.  SURGEON:  Guy Sandifer. Henderson Cloud, MD  ASSISTANT:  Juluis Mire, MD  ANESTHESIA:  General with endotracheal intubation.  ESTIMATED BLOOD LOSS:  200 mL.  SPECIMENS:  Uterus, bilateral tubes and ovaries to pathology.  INDICATIONS AND CONSENT:  This patient is a 50 year old white female with uterine leiomyomata and symptomatic pelvic relaxation.  Details are dictated in the history and physical.  Laparoscopically-assisted vaginal hysterectomy with BSO, anterior-posterior colporrhaphy and sacrospinous ligament suspension have been discussed preoperatively.  Potential risks and complications have been discussed preoperatively including but limited to infection, organ damage, bleeding requiring transfusion of blood products with HIV and hepatitis acquisition, DVT, PE, pneumonia, fistula formation, laparotomy, pelvic pain, painful intercourse, and perineal pain.  Issues of menopause have also been reviewed.  All questions have been answered and consent is signed in the chart.  FINDINGS:  Upper abdomen is grossly normal.  Uterus has at least 2 subserosal leiomyomata and this is approximately 10 weeks in size with irregular contour.  Right ovary has a 2-3 cm smooth translucent cyst and the left ovary appears normal.  Anterior and  posterior cul-de-sacs are normal.  PROCEDURE IN DETAIL:  The patient was taken to operating room where she was identified and placed in dorsal supine position.  General anesthesia was induced via endotracheal intubation.  She was then placed in dorsal lithotomy position.  Time-out was undertaken.  She was prepped abdominally and vaginally.  Bladder straight catheterized.  Hulka tenaculum was placed in the uterus as a manipulator and she was draped in a sterile fashion.  The infraumbilical and suprapubic areas were injected in the midline with approximately 5 mL of 1.5% plain Marcaine. A small infraumbilical incision was made.  A disposable Veress needle was placed on the first attempt without difficulty.  A good syringe and drop test were noted.  Two liters of gas were then insufflated under low pressure with good tympany in the right upper quadrant.  Veress needle was removed and a 10-11 XL bladeless disposable trocar sleeve was placed using direct visualization without difficulty.  The operative laparoscope was then used.  Suprapubic trocar sleeve, 5 mm in size was then placed under direct visualization without difficulty.  The above findings were noted.  After identifying the course of the ureters well cleared in the area of surgery, the right infundibulopelvic ligament was taken down with the and Enseal bipolar cautery cutting instrument.  This was carried on down across the round ligament down to the level of the vesicouterine peritoneum.  This was also done on the left side. Vesicouterine peritoneum was then taken down cephalad laterally.  Good  hemostasis was noted.  Suprapubic trocar sleeve was removed, instruments were removed, and attention was turned to the vagina.  Posterior cul-de- sac was entered sharply and the cervix was circumscribed with unipolar cautery.  Mucosa was advanced sharply and bluntly.  Then using the Enseal products for several bites and then switching to the  LigaSure bipolar cautery cutting instrument, the uterosacral ligaments were taken down followed by the bladder pillars, cardinal ligaments, and uterine vessels bilaterally.  Fundus was delivered posteriorly.  Remaining pedicles were taken down and the specimen was delivered.  All suture will be 0-Monocryl unless otherwise designated.  Uterosacral ligaments were then plicated with vaginal cuff bilaterally.  Additional suture was placed at 3 and 9 o'clock positions to ensure hemostasis on the cuff as well.  The uterosacral ligaments were then plicated in the midline with a third suture.  Cuff was closed with figure-of-eight on the posterior half.  The anterior vaginal mucosa was then injected submucosally with a dilute 1.5% lidocaine with 1:200,000 epinephrine which has been diluted in saline.  The mucosa was then taken down in the midline and dissected bilaterally from the underlying bladder.  The vesicovaginal fascia was then reapproximated in the midline with interrupted 0-Monocryl suture. Excess mucosa was trimmed and the anterior vaginal mucosa was closed with interrupted 2-0 Vicryl suture which was also used to reapproximate the anterior half of the vaginal cuff.  Posterior vaginal mucosa was then injected with the same solution.  A small diamond shaped wedge of tissue was removed from the posterior perineal body.  Posterior vaginal mucosa was then dissected from the underlying rectum sharply and bluntly.  This was carried out approximately two-thirds of the length in the posterior wall of the vagina.  Blunt dissection was then used to reach the sacrospinous ligament.  The ischial spine was carefully palpated.  Using the Capio needle driver, a Vicryl suture was placed at least 2 fingerbreadths medial to the ischial spine through the ligament. This was then plicated to the submucosal side of the vaginal mucosa and held.  The rectovaginal fascia was then reapproximated in the  midline with interrupted 0-Monocryl sutures.  Excess mucosa was trimmed and the top half of the vaginal mucosa was reapproximated in running locking fashion with a 2-0 Vicryl suture.  The sacrospinous stitch was then cinched down which elevates the apex of the vagina quite well.  The vaginal mucosa was then trimmed continuing on with the same 2-0 Vicryl suture and closing the perineal body in a standard episiotomy type fashion as well.  Cystoscopy was then carried out with 70-degree cystoscope.  The 360-degree inspection reveals no foreign bodies, no evidence of perforation, and a good puff of indigo carmine from the ureters bilaterally.  Foley catheter was placed in the bladder to drain and the vagina was packed with 1-inch gauze with estrogen cream. Attention was then returned to the abdomen.  Pneumoperitoneum was reintroduced and under direct visualization the suprapubic trocar was then reintroduced as well.  Irrigation was carried out.  Minor bleeding at peritoneal edges was controlled with the bipolar cautery.  Inspection under reduced pneumoperitoneum reveals good hemostasis.  The remainder of 25 mL of 1.5% plain Marcaine was injected into the peritoneal cavity. Suprapubic trocar sleeve was removed, instruments were removed, and the pneumoperitoneum was completely reduced and the umbilical trocar sleeve was removed.  The umbilical and suprapubic incisions were closed with subcuticular 2-0 Vicryl suture. Dermabond was placed on both.  All counts were correct.  The patient was  awakened and taken to the recovery room in stable condition.     Guy Sandifer Henderson Cloud, M.D.     JET/MEDQ  D:  04/10/2011  T:  04/10/2011  Job:  [id number]

## 2011-04-10 NOTE — Anesthesia Preprocedure Evaluation (Signed)
Anesthesia Evaluation  Name, MR# and DOB Patient awake  General Assessment Comment  Reviewed: Allergy & Precautions, H&P , NPO status , Patient's Chart, lab work & pertinent test results, reviewed documented beta blocker date and time   History of Anesthesia Complications Negative for: history of anesthetic complications  Airway Mallampati: II TM Distance: >3 FB Neck ROM: full    Dental  (+) Teeth Intact   Pulmonary (+) shortness of breath (relates to smoking)   clear to auscultation  breath sounds clear to auscultation none    Cardiovascular Exercise Tolerance: Good + angina (had chest pain eval with stress test at [facility].  Stress test was negative., per pt) regular Normal    Neuro/Psych  Neuromuscular disease (chronic back pain - DDD, on percocet) Negative Psych ROS  GI/Hepatic/Renal negative GI ROS  negative Liver ROS  negative Renal ROS   GERD Medicated     Endo/Other  Negative Endocrine ROS (+)      Abdominal   Musculoskeletal   Hematology negative hematology ROS (+)   Peds  Reproductive/Obstetrics (+) Pregnancy    Anesthesia Other Findings Smoker - 1ppd x 30 years            Anesthesia Physical Anesthesia Plan  ASA: II  Anesthesia Plan: General   Post-op Pain Management:    Induction:   Airway Management Planned:   Additional Equipment:   Intra-op Plan:   Post-operative Plan:   Informed Consent: I have reviewed the patients History and Physical, chart, labs and discussed the procedure including the risks, benefits and alternatives for the proposed anesthesia with the patient or authorized representative who has indicated his/her understanding and acceptance.   Dental Advisory Given  Plan Discussed with: CRNA  Anesthesia Plan Comments:         Anesthesia Quick Evaluation

## 2011-04-10 NOTE — Anesthesia Procedure Notes (Addendum)
Date/Time: 04/10/2011 7:26 AM Performed by: Carlyle Lipa Pre-anesthesia Checklist: Patient identified Patient Re-evaluated:Patient Re-evaluated prior to inductionOxygen Delivery Method: Circle System Utilized Preoxygenation: Pre-oxygenation with 100% oxygen Intubation Type: IV induction Ventilation: Mask ventilation without difficulty Laryngoscope Size: Miller and 2 Tube type: Oral Tube size: 7.0 mm Number of attempts: 1 Placement Confirmation: ETT inserted through vocal cords under direct vision Secured at: 7 cm Tube secured with: Tape Dental Injury: Teeth and Oropharynx as per pre-operative assessment

## 2011-04-11 MED ORDER — IBUPROFEN 800 MG PO TABS: 800.0000 mg | ORAL_TABLET | Freq: Three times a day (TID) | ORAL | Status: AC | PRN

## 2011-04-11 MED ORDER — PROMETHAZINE HCL 25 MG/ML IJ SOLN
12.5000 mg | Freq: Four times a day (QID) | INTRAMUSCULAR | Status: DC | PRN
  Administered 2011-04-11: 12.5 mg via INTRAVENOUS
  Filled 2011-04-11: qty 1

## 2011-04-11 MED ORDER — OXYCODONE-ACETAMINOPHEN 5-325 MG PO TABS: 1.0000 | ORAL_TABLET | ORAL | Status: DC | PRN

## 2011-04-11 MED ORDER — CIPROFLOXACIN HCL 250 MG PO TABS: 250.0000 mg | ORAL_TABLET | Freq: Two times a day (BID) | ORAL | Status: DC

## 2011-04-11 NOTE — Discharge Summary (Signed)
Physician Discharge Summary  Patient ID: Courtney Villarreal MRN: [id number] DOB/AGE: 1961/02/09 50 y.o.  Admit date: 04/10/2011 Discharge date: 04/11/2011  Admission Diagnoses: Fibroids, Pelvic Relaxation  Discharge Diagnoses:  Same  Discharged Condition: good  Hospital Course: Admitted to hospital, to OR for LAVH/BSO, A&P repair with SSLS.  Post op course stable, some nausea.  Passing flatus, ambulating.  Unable to void.  Home with Foley-instructions given.  Consults: none  Significant Diagnostic Studies: labs:    Treatments: IV hydration and analgesia: acetaminophen w/ codeine and toradol  Discharge Exam: Abd soft with good BS Scant Bleeding  Disposition: Home   Current Discharge Medication List    START taking these medications   Details  ciprofloxacin (CIPRO) 250 MG tablet Take 1 tablet (250 mg total) by mouth 2 (two) times daily. Qty: 10 tablet, Refills: 0    ibuprofen (ADVIL,MOTRIN) 800 MG tablet Take 1 tablet (800 mg total) by mouth every 8 (eight) hours as needed for pain (mild pain). Qty: 30 tablet, Refills: 0    oxyCODONE-acetaminophen (PERCOCET) 5-325 MG per tablet Take 1-2 tablets by mouth every 4 (four) hours as needed (moderate to severe pain (when tolerating fluids)). Qty: 40 tablet, Refills: 0      STOP taking these medications     naproxen sodium (ANAPROX) 220 MG tablet      ranitidine (ZANTAC) 150 MG tablet        Follow-up Information    Follow up with Courtney Villarreal,Courtney Magnussen E. Call in 1 day.   Contact information:   [address] [place] [address] [phone]          Signed: Leslie Andrea 04/11/2011, 11:36 AM

## 2011-04-11 NOTE — Progress Notes (Addendum)
Pt voided .  Does not want to wait for 2nd void to confirm no difficulties with urination.  Phoned Dr Henderson Cloud with update.  He stated pt could be discharged home without catheter, she does not need to take Cipro prescription, and that she should return to office in 2 weeks for follow-up appointment.

## 2011-04-11 NOTE — Discharge Instructions (Signed)
No vaginal entry, no heavy lifting , no operation automobiles

## 2011-04-11 NOTE — Progress Notes (Signed)
Some nausea, better after phenergan Passing flatus Unable to void  Vss Afeb Lungs CTA Cor RRR Abd soft with good BS  A/P:  D/C home         Foley cath to SD-FU in office tomorrow         Cipro 250mg , #10, 1 po bid while cath in place         Percocet 5/325mg , #40, 1-2 po q6hrs prn          Zantac q12 hours

## 2011-04-12 ENCOUNTER — Encounter (HOSPITAL_COMMUNITY): Payer: Self-pay

## 2011-04-12 ENCOUNTER — Inpatient Hospital Stay (HOSPITAL_COMMUNITY)
Admission: AD | Admit: 2011-04-12 | Discharge: 2011-04-13 | Disposition: A | Discharge status: Home / Self Care | Payer: BC Managed Care – PPO | Source: Ambulatory Visit | Attending: Obstetrics and Gynecology | Admitting: Obstetrics and Gynecology

## 2011-04-12 DIAGNOSIS — R350 Frequency of micturition: Secondary | ICD-10-CM | POA: Insufficient documentation

## 2011-04-12 DIAGNOSIS — N760 Acute vaginitis: Secondary | ICD-10-CM

## 2011-04-12 DIAGNOSIS — K5900 Constipation, unspecified: Secondary | ICD-10-CM | POA: Insufficient documentation

## 2011-04-12 HISTORY — DX: Nausea with vomiting, unspecified: R11.2

## 2011-04-12 HISTORY — DX: Other specified postprocedural states: Z98.890

## 2011-04-12 HISTORY — DX: Reserved for concepts with insufficient information to code with codable children: IMO0002

## 2011-04-12 LAB — URINALYSIS, ROUTINE W REFLEX MICROSCOPIC
Bilirubin Urine: NEGATIVE
Ketones, ur: NEGATIVE mg/dL
Nitrite: NEGATIVE
Urobilinogen, UA: 0.2 mg/dL (ref 0.0–1.0)
pH: 7.5 (ref 5.0–8.0)

## 2011-04-12 LAB — COMPREHENSIVE METABOLIC PANEL
Albumin: 2.8 g/dL — ABNORMAL LOW (ref 3.5–5.2)
BUN: 7 mg/dL (ref 6–23)
Calcium: 8.4 mg/dL (ref 8.4–10.5)
Creatinine, Ser: 0.51 mg/dL (ref 0.50–1.10)
GFR calc Af Amer: >60 mL/min (ref >60)
Glucose, Bld: 117 mg/dL — ABNORMAL HIGH (ref 70–99)
Potassium: 3.7 mEq/L (ref 3.5–5.1)
Total Protein: 5.3 g/dL — ABNORMAL LOW (ref 6.0–8.3)

## 2011-04-12 LAB — CBC
MCH: 31.9 pg (ref 26.0–34.0)
MCV: 96 fL (ref 78.0–100.0)
Platelets: 140 10*3/uL — ABNORMAL LOW (ref 150–400)
RDW: 13.2 % (ref 11.5–15.5)

## 2011-04-12 MED ORDER — HYDROMORPHONE HCL 2 MG PO TABS: 2.0000 mg | ORAL_TABLET | Freq: Four times a day (QID) | ORAL | Status: AC | PRN

## 2011-04-12 MED ORDER — AMPICILLIN 500 MG PO CAPS: 500.0000 mg | ORAL_CAPSULE | Freq: Four times a day (QID) | ORAL | Status: AC

## 2011-04-12 MED ORDER — LACTATED RINGERS IV BOLUS (SEPSIS)
250.0000 mL | Freq: Once | INTRAVENOUS | Status: AC
  Administered 2011-04-12: 250 mL via INTRAVENOUS

## 2011-04-12 MED ORDER — SODIUM CHLORIDE 0.9 % IV SOLN
3.0000 g | Freq: Once | INTRAVENOUS | Status: AC
  Administered 2011-04-12: 3 g via INTRAVENOUS
  Filled 2011-04-12: qty 3

## 2011-04-12 MED ORDER — MAGNESIUM CITRATE PO SOLN: 148.0000 mL | Freq: Once | ORAL | Status: AC

## 2011-04-12 MED ORDER — HYDROMORPHONE HCL 1 MG/ML IJ SOLN
2.0000 mg | Freq: Once | INTRAMUSCULAR | Status: AC
  Administered 2011-04-12: 2 mg via INTRAVENOUS
  Filled 2011-04-12: qty 2

## 2011-04-12 NOTE — Progress Notes (Signed)
50 yo S/P LAVH/BSO/A&P with SSLS 4 days ago. Sent home POD #1. Hx of back pain with bulging disc. C/O discomfort with urination and question of completely emptying bladder.  Chills x1 earlier today. No N/V, blood in urine, heavy vaginal bleeding.  No BM since surgery.  Is passing flatus.  PE: VSS Temp 101 , now afeb after IV fluids Pt in NAD Skin W&D Lungs CTA Cor RRR Abd soft, NT, good BS x 4, non distended Pelvic one finger exam, NT without masses  Foley cath placed-small amt clear dilute urine  UA-normal, dilute CBC-normal  A: Constipation     Poss cuff cellulitis  P: IV Unasyn x 1 dose here, then Augmentin 500mg , #10, 1 po bid, Dilaudid 2mg , #30, 1 po q6hrs prn, will stop Percocet     Magnesium Citrate  1/2 bottle      Rest/fluids     FU in office 4-5 days

## 2011-04-13 NOTE — Progress Notes (Signed)
D/C HOME-  TOOK OUT TO [employer]UCK IN W/C - GAVE 2 RX

## 2011-04-16 ENCOUNTER — Encounter (HOSPITAL_COMMUNITY): Payer: Self-pay | Admitting: Obstetrics and Gynecology

## 2011-04-20 ENCOUNTER — Other Ambulatory Visit: Payer: Self-pay | Admitting: Gynecology

## 2011-04-20 DIAGNOSIS — R928 Other abnormal and inconclusive findings on diagnostic imaging of breast: Secondary | ICD-10-CM

## 2011-05-17 ENCOUNTER — Ambulatory Visit
Admission: RE | Admit: 2011-05-17 | Discharge: 2011-05-17 | Disposition: A | Discharge status: Home / Self Care | Payer: BC Managed Care – PPO | Source: Ambulatory Visit | Attending: Gynecology | Admitting: Gynecology

## 2011-05-17 DIAGNOSIS — R928 Other abnormal and inconclusive findings on diagnostic imaging of breast: Secondary | ICD-10-CM

## 2011-05-18 ENCOUNTER — Other Ambulatory Visit: Payer: Self-pay | Admitting: Gynecology

## 2011-05-18 DIAGNOSIS — R921 Mammographic calcification found on diagnostic imaging of breast: Secondary | ICD-10-CM

## 2011-05-18 DIAGNOSIS — N6099 Unspecified benign mammary dysplasia of unspecified breast: Secondary | ICD-10-CM

## 2011-05-24 ENCOUNTER — Other Ambulatory Visit: Payer: BC Managed Care – PPO

## 2011-05-25 ENCOUNTER — Other Ambulatory Visit: Payer: BC Managed Care – PPO

## 2011-05-28 ENCOUNTER — Ambulatory Visit
Admission: RE | Admit: 2011-05-28 | Discharge: 2011-05-28 | Disposition: A | Discharge status: Home / Self Care | Payer: BC Managed Care – PPO | Source: Ambulatory Visit | Attending: Gynecology | Admitting: Gynecology

## 2011-05-28 DIAGNOSIS — N6099 Unspecified benign mammary dysplasia of unspecified breast: Secondary | ICD-10-CM

## 2011-05-28 DIAGNOSIS — R921 Mammographic calcification found on diagnostic imaging of breast: Secondary | ICD-10-CM

## 2011-05-28 MED ORDER — GADOBENATE DIMEGLUMINE 529 MG/ML IV SOLN
15.0000 mL | Freq: Once | INTRAVENOUS | Status: AC | PRN
  Administered 2011-05-28: 15 mL via INTRAVENOUS

## 2011-06-01 ENCOUNTER — Ambulatory Visit (INDEPENDENT_AMBULATORY_CARE_PROVIDER_SITE_OTHER): Payer: BC Managed Care – PPO | Admitting: Surgery

## 2011-06-01 ENCOUNTER — Encounter (INDEPENDENT_AMBULATORY_CARE_PROVIDER_SITE_OTHER): Payer: Self-pay | Admitting: Surgery

## 2011-06-01 VITALS — BP 118/76 | HR 60 | Temp 97.2°F | Resp 14 | Ht 65.0 in | Wt 156.0 lb

## 2011-06-01 DIAGNOSIS — N6099 Unspecified benign mammary dysplasia of unspecified breast: Secondary | ICD-10-CM

## 2011-06-01 DIAGNOSIS — N6089 Other benign mammary dysplasias of unspecified breast: Secondary | ICD-10-CM

## 2011-06-01 NOTE — Patient Instructions (Signed)
Call if you decide to have breast biopsy.  If you need a second opinion we can arrange for you.  Let us know if you decide to follow this.

## 2011-06-01 NOTE — Progress Notes (Signed)
Patient ID: Courtney Villarreal, female   DOB: [birth year], 50 y.o.   MRN: [id number]  Chief Complaint  Patient presents with  . Other    Eval of right breast ADH    HPI Courtney Villarreal is a 50 y.o. female. HPI  The patient presents today at the request of Dr.  Micheline Maze do to an abnormal mammogram. She denies any history of breast pain, breast mass or nipple discharge. Her mammogram showed clusters of microcalcifications in the right breast upper-outer quadrant. There were 2 areas that underwent biopsy that showed atypical ductal hyperplasia.  She is noted complaints.  Past Medical History  Diagnosis Date  . Shortness of breath     smoker  . GERD (gastroesophageal reflux disease)     takes otc zantac prn  . Degenerative disc disease     L4-5, uses aleve  . History of chest pain 2009    had stress test in [facility]-requested copy of records-no cardiac findings per pt  . Degenerative disk disease   . PONV (postoperative nausea and vomiting)     Past Surgical History  Procedure Date  . Conization of cervix     age 89  . Abdominal hysterectomy 04/10/2011  . Laparoscopic assisted vaginal hysterectomy 04/10/2011    Procedure: LAPAROSCOPIC ASSISTED VAGINAL HYSTERECTOMY;  Surgeon: Roselle Locus II;  Location: [facility];  Service: Gynecology;  Laterality: N/A;  . Salpingoophorectomy 04/10/2011    Procedure: SALPINGO OOPHERECTOMY;  Surgeon: Roselle Locus II;  Location: [facility];  Service: Gynecology;  Laterality: Bilateral;  . Anterior and posterior repair 04/10/2011    Procedure: ANTERIOR (CYSTOCELE) AND POSTERIOR REPAIR (RECTOCELE);  Surgeon: Roselle Locus II;  Location: [facility];  Service: Gynecology;  Laterality: N/A;  Sacrospinous Ligament Suspension    History reviewed. No pertinent family history.  Social History History  Substance Use Topics  . Smoking status: Current Everyday Smoker -- 1.0 packs/day for 31 years    Types: Cigarettes  . Smokeless tobacco: Never Used  . Alcohol Use: Yes    No Known  Allergies  No current outpatient prescriptions on file.    Review of Systems Review of Systems  Constitutional: Negative for fever, chills and unexpected weight change.  HENT: Negative for hearing loss, congestion, sore throat, trouble swallowing and voice change.   Eyes: Negative for visual disturbance.  Respiratory: Negative for cough and wheezing.   Cardiovascular: Negative for chest pain, palpitations and leg swelling.  Gastrointestinal: Negative for nausea, vomiting, abdominal pain, diarrhea, constipation, blood in stool, abdominal distention and anal bleeding.  Genitourinary: Negative for hematuria, vaginal bleeding and difficulty urinating.  Musculoskeletal: Negative for arthralgias.  Skin: Negative for rash and wound.  Neurological: Negative for seizures, syncope and headaches.  Hematological: Negative for adenopathy. Does not bruise/bleed easily.  Psychiatric/Behavioral: Negative for confusion.    Blood pressure 118/76, pulse 60, temperature 97.2 F (36.2 C), temperature source Temporal, resp. rate 14, height 5\' 5"  (1.651 m), weight 156 lb (70.761 kg).  Physical Exam Physical Exam  Constitutional: She appears well-developed and well-nourished.  HENT:  Head: Normocephalic and atraumatic.  Eyes: EOM are normal. Pupils are equal, round, and reactive to light.  Neck: Normal range of motion. Neck supple.  Cardiovascular: Normal rate and regular rhythm.   Pulmonary/Chest: Effort normal and breath sounds normal.       Normal breast exam. No masses in either breast. Both axilla normal. No nipple discharge bilaterally.  Skin: Skin is warm and dry.  Psychiatric: She has a normal  mood and affect. Her behavior is normal. Judgment and thought content normal.    Data Reviewed Mammogram shows a punctate calcifications right breast upper outer quadrant. 2 foci were biopsied showing atypical ductal hyperplasia. MRI shows a diffuse nodularity to both breasts which was nonspecific.  Biopsy results showed atypical ductal hyperplasia  Assessment    Right breast atypical ductal hyperplasia    Plan    I had a lengthy discussion with the patient about options. I recommend excision of these areas of microcalcifications in the right breast using needle localization. The areas would have to be bracketed. She is unsure if she wishes to do this. I explained there is a possible chance there could be breast cancer within this area despite her core biopsy results. I recommended excision of these areas but she was time to think about this and possibly obtain a second opinion. I've asked her to contact me to let me know ordered decision is so we could either follow her or schedule surgery.       Aashritha [name] 06/01/2011, 12:24 PM

## 2011-07-17 HISTORY — PX: BREAST EXCISIONAL BIOPSY: SUR124

## 2012-03-04 ENCOUNTER — Other Ambulatory Visit: Payer: Self-pay | Admitting: Gynecology

## 2012-03-04 DIAGNOSIS — N6453 Retraction of nipple: Secondary | ICD-10-CM

## 2012-03-07 ENCOUNTER — Ambulatory Visit
Admission: RE | Admit: 2012-03-07 | Discharge: 2012-03-07 | Disposition: A | Discharge status: Home / Self Care | Payer: BC Managed Care – PPO | Source: Ambulatory Visit | Attending: Gynecology | Admitting: Gynecology

## 2012-03-07 DIAGNOSIS — N6453 Retraction of nipple: Secondary | ICD-10-CM

## 2012-03-08 NOTE — ED Provider Notes (Signed)
MEDICATION ADMINISTRATION SUMMARY         Drug Name: Vicodin, Dose Ordered: 1 tab(s), Route: Oral, Status:         Given, Time: 00:23 03/09/2012,         Drug Name: Jerilynn Som, Dose Ordered: 200 mg, Route: Oral,         Status: Given, Time: 00:23 03/09/2012,         Drug Name: PredniSONE, Dose Ordered: 60 mg , Route: Oral, Status:         Given, Time: 00:23 03/09/2012, Detailed record available in Medication         Service section.   KNOWN ALLERGIES   Codeine Sulfate: - itching   TRIAGE (21:23 WAB1)   PATIENT: NAME: [name]: female, DOB: Sat Jul 30, 1960, TIME OF GREET: Sat Mar 08, 2012 21:18 by Justice Deeds, RN,         LANGUAGE: [place]. (21:23 WAB1)   ADMISSION: URGENCY: 4, TRANSPORT: Ambulatory, DEPT: Emergency,         BED: WAITING. (21:23 WAB1)   VITAL SIGNS: BP 127/71, (Sitting), Pulse 93, Resp 16, Temp 98.1,         (Oral), Pain 7, O2 Sat 98, on Room air, Time 03/08/2012 21:21. (21:23         WAB1)   COMPLAINT:  headache, cold symptoms. (21:23 WAB1)   CONFIRM: Patient states no change in past medical history. (21:48         NLL0)   PRESENTING COMPLAINT:  pt states for 1.5 months she has had a         productive cough. went to [facility] when it first started was given z-pac,         no better 2 weeks later then back at [facility] 1 week ago, dx with         allergies and given zyrtec and flonase no relief. pt c/o productive         cough with clear to green mucus, frontal and facial H/A, runny nose,         sore throat, and fevers on and off x 1.5 months. no chest xray has         been done. denies n/v or ear pain but states her ear feel blocked up.         has tried several otc meds, mucinex, delsyn, motrin, tussin cough,         albuterol inhalor, and flonase without relief., For 1, months. (21:48         NLL0)   PAIN: Patient complains of pain, Pain described as aching, On a         scale 0-10 patient rates pain as 7, H/a chest, Pain is constant, No          modifying factors, No efforts tried to eliminate symptoms. (21:48         NLL0)   LMP: LMP: Menopause, Patient denies she is pregnant, Pt on birth         control, Tubal Ligation. (21:48 NLL0)   TB SCREENING: TB screen negative for this patient. (21:48 NLL0)   ABUSE SCREENING: Patient denies physical abuse or threats. (21:48         NLL0)   FALL RISK: Patient has a low risk of falling. (21:48 NLL0)   SUICIDAL IDEATION: Suicidal ideation is not present. (21:48 NLL0)   ADVANCE DIRECTIVES: Patient does not have advance  directives.         (21:48 NLL0)   PROVIDERS: TRIAGE NURSE: Consulting civil engineer, RN. (21:23 WAB1)   PREVIOUS VISIT ALLERGIES: Morphine Sulfate, Shellfish. (21:48         NLL0)   PRESENTING PROBLEM (Sat Mar 08, 2012 21:23 WAB1)      Presenting problems: Headache, Upper Respiratory Infection.   GREET (21:18 WAB1)   GREET: Greet: Sat Mar 08, 2012 21:18.   CURRENT MEDICATIONS     No recorded medications   MEDICATION SERVICE   PredniSONE:  Order: PredniSONE (Prednisone) - Dose: 60         mg : Oral         Ordered by: Verlin Grills, PA-C         Entered by: Verlin Grills, PA-C Sat Mar 08, 2012 23:42 ,         Acknowledged by: Catha Brow, RN Sat Mar 08, 2012 23:47         Documented as given by: Catha Brow, RN Sun Mar 09, 2012 00:23         Patient, Medication, Dose, Route and Time verified prior to         administration.          Amount given: 60mg , Site: Medication administered P.O., Mouth check         performed after administration of medication, Correct patient, time,         route, dose and medication confirmed prior to administration, Patient         advised of actions and side-effects prior to administration,         Allergies confirmed and medications reviewed prior to administration,         Emotional support needed and given, Patient tolerated procedure well,         Patient in position of comfort, Side rails up, Cart in lowest         position, Family at bedside.    Tessalon Perles:  Order: Lawyer (Benzonatate) -         Dose: 200 mg : Oral         Ordered by: Jannetta Quint, M.D.         Entered by: Jannetta Quint, M.D. Sat Mar 08, 2012 23:58 ,         Acknowledged by: Catha Brow, RN Wynelle Link Mar 09, 2012 00:23         Documented as given by: Catha Brow, RN Sun Mar 09, 2012 00:23         Patient, Medication, Dose, Route and Time verified prior to         administration.          Amount given: 200mg , Site: Medication administered P.O., Mouth check         performed after administration of medication, Correct patient, time,         route, dose and medication confirmed prior to administration, Patient         advised of actions and side-effects prior to administration,         Allergies confirmed and medications reviewed prior to administration,         Emotional support needed and given, Patient tolerated procedure well,         Patient in position of comfort, Side rails up, Cart in lowest         position, Family at bedside.   Vicodin:  Order: Vicodin (Acetaminophen/Hydrocodone Bitartrate) -  Dose: 1 tab(s) : Oral         POTENTIAL ALLERGY REACTION: 'Codeine Sulfate' - Not a true allergy         Ordered by: Jannetta Quint, M.D.         Entered by: Jannetta Quint, M.D. Sat Mar 08, 2012 23:58 ,         Acknowledged by: Catha Brow, RN Wynelle Link Mar 09, 2012 00:23         Documented as given by: Catha Brow, RN Sun Mar 09, 2012 00:23         Patient, Medication, Dose, Route and Time verified prior to         administration.          Amount given: 1tab, Site: Medication administered P.O., Mouth check         performed after administration of medication, Correct patient, time,         route, dose and medication confirmed prior to administration, Patient         advised of actions and side-effects prior to administration,         Allergies confirmed and medications reviewed prior to administration,         Emotional support needed and given, Patient tolerated procedure  well,         Patient in position of comfort, Side rails up, Cart in lowest         position, Family at bedside.   ORDERS (23:03 COL1)   CHEST 2 VIEWS:  Ordered for: Buddy Duty, M.D., Courtney         Status: Active.   HAND HELD NEBULIZER:  Ordered for: Buddy Duty, M.D., Toni Amend         Status: Active.   NURSING ASSESSMENT: HEADACHE (22:54 RLS1)   CONSTITUTIONAL: History obtained from patient, Patient arrives         ambulatory, Gait steady, Patient appears comfortable, Patient         cooperative, Patient alert, Oriented to person, place and time, Skin         warm, Skin dry, Skin normal in color, Mucous membranes pink, Mucous         membranes moist, Patient is well-groomed.   PAIN: aching pain, to the frontal region, constant, on a scale         0-10 patient rates pain as 7.   HEADACHE: Precipitating factors include, cough + facial pressure         x8month and half.   NEURO: Pupils equally round and reactive to light, Able to close         eyes, Face symmetrical, Speech normal.   ENT: Able to swallow, Speech normal, Associated with fever,         on/off.   NOTES: Emotional support needed and given, Patient tolerated         procedure well.   SAFETY: Side rails up, Cart/Stretcher in lowest position, Call         light within reach, Hospital ID band on.   NURSING PROCEDURE: DISCHARGE NOTE Wynelle Link Mar 09, 2012 00:25 RLS1)   DISCHARGE: Patient discharged to home, ambulating without         assistance, family driving, accompanied by husband/wife/partner,         Summary of Care printed/ provided, Discharge instructions given to         patient, Simple or moderate discharge teaching performed,         Prescriptions given and instructions on side  effects given,         Medication reconciliation form given, Above person(s) verbalized         understanding of discharge instructions and follow-up care.   BELONGINGS: Belongings and valuables with patient at time of         discharge include:, Belongings remain with patient, Valuables  remain         with patient.   NOTES: Emotional support needed and given, Patient tolerated         procedure well.   SAFETY: Side rails up, Cart/Stretcher in lowest position, Family         at bedside, Call light within reach, Hospital ID band on.   DIAGNOSIS (23:55 COL1)   FINAL: PRIMARY: Acute Sinusitis [NOS].   DISPOSITION   PATIENT:  Disposition Type: Discharged, Disposition: Discharge,         Condition: Stable. (23:55 COL1)      Patient left the department. Wynelle Link Mar 09, 2012 00:25 RLS1)   VITAL SIGNS (21:23 WAB1)   VITAL SIGNS: BP: 127/71 (Sitting), Pulse: 93, Resp: 16, Temp:         98.1 (Oral), Pain: 7, O2 sat: 98 on Room air, Time: 03/08/2012 21:21.   INSTRUCTION Wynelle Link Mar 09, 2012 00:12 COL1)   DISCHARGE:  SINUSITIS (SINUS INFECTION).   SPECIAL:  Rest. Encourage fluids.         OTC decongestant such as Sudafed (pseudoephedrine).         Use inhaler every 4 hours for cough/wheezing.         Take steroids and antibiotics as directed.         Vicodin as needed for cough/headache; DO NOT take any other         tylenol(acetaminophen) containing products while taking Vicodin.         Follow up with primary care physician next week if not improving.         Do not drive or operate machinery while medicated on Vicodin.   EVENTS   TRANSFER:  Triage to Emergency Waiting. (Sat Mar 08, 2012 21:23         WAB1)      Emergency Waiting to Main [facility]. (21:26 WAB1)      Removed from Emergency Main [facility]. Wynelle Link Mar 09, 2012 00:26 RLS1)   PRESCRIPTION   Albuterol Sulfate:  Aerosol : 90 mcg/inh : Inhalation : Quantity:         *** 2 puffs *** Unit: Route: Inhalation Schedule: every 4 hours         Dispense: *** 1 MDI ***. (23:41 COL1)   NOTES:  prn wheezing          No refills          May use generic. (23:41 COL1)   Levaquin:  Tablet : 750 Mg : Oral : Quantity: *** 1 *** Unit:         tab(s) Route: Oral Schedule: once a day Dispense: *** 5 ***         POTENTIAL SEVERE INTERACTION: PredniSONE . (23:52 COL1)    NOTES:  No refills          May use generic. (23:52 COL1)   Vicodin:  Tablet : 300 Mg-5 Mg : Oral : Quantity: *** 1 *** Unit:         tab(s) Route: Oral Schedule: As needed every 6 hours Dispense: *** 12         ***  POTENTIAL ALLERGY REACTION: 'Codeine Sulfate'         Override Rationale: Not a true allergy. Wynelle Link Mar 09, 2012 00:09 COL1)   NOTES:  No refills          May use generic. Wynelle Link Mar 09, 2012 00:09 COL1)   PredniSONE:  Tablet : 20 mg : Oral : Quantity: *** * *** Unit:         Route: Oral Schedule: See Notes Dispense: *** qs ***         POTENTIAL SEVERE INTERACTION: Levaquin         Override Rationale: Low risk interaction. Wynelle Link Mar 09, 2012 00:09         COL1)   NOTES:  3 tabs PO once daily for 3 days          No refills          May use generic. Wynelle Link Mar 09, 2012 00:09 COL1)   Key:     COL1=OLeary, PA-C, Jill Side CTZ0=Zydron, M.D., Toni Amend NLL0=Lee Lpn,     Joni Reining     RLS1=Senn, RN, Rene Kocher  WAB1=Bennetch, RN, [employer]

## 2012-04-04 ENCOUNTER — Encounter (INDEPENDENT_AMBULATORY_CARE_PROVIDER_SITE_OTHER): Payer: Self-pay | Admitting: Surgery

## 2012-05-05 ENCOUNTER — Other Ambulatory Visit (INDEPENDENT_AMBULATORY_CARE_PROVIDER_SITE_OTHER): Payer: Self-pay | Admitting: Surgery

## 2012-05-05 ENCOUNTER — Ambulatory Visit (INDEPENDENT_AMBULATORY_CARE_PROVIDER_SITE_OTHER): Payer: BC Managed Care – PPO | Admitting: Surgery

## 2012-05-05 ENCOUNTER — Encounter (INDEPENDENT_AMBULATORY_CARE_PROVIDER_SITE_OTHER): Payer: Self-pay | Admitting: Surgery

## 2012-05-05 VITALS — BP 100/62 | HR 64 | Temp 97.2°F | Resp 20 | Ht 65.0 in | Wt 150.6 lb

## 2012-05-05 DIAGNOSIS — N6099 Unspecified benign mammary dysplasia of unspecified breast: Secondary | ICD-10-CM

## 2012-05-05 DIAGNOSIS — N6089 Other benign mammary dysplasias of unspecified breast: Secondary | ICD-10-CM

## 2012-05-05 NOTE — Progress Notes (Signed)
Patient ID: Courtney Villarreal, female   DOB: 07/12/1961, 51 y.o.   MRN: [id number]  Chief Complaint  Patient presents with  . Pre-op Exam    eval nipple retraction    HPI Courtney Villarreal is a 51 y.o. female. HPI  The patient presents today at the request of Dr.  Lawrence  do to an abnormal mammogram. She denies any history of breast pain, breast mass but has  nipple discharge. Her mammogram showed clusters of microcalcifications in the right breast upper-outer quadrant. There were 2 areas that underwent biopsy that showed atypical ductal hyperplasia.  She is noted complaints.  She was seen 1 year ago and decided against biopsy.  Now she has right nipple retraction and white discharge Past Medical History  Diagnosis Date  . Shortness of breath     smoker  . GERD (gastroesophageal reflux disease)     takes otc zantac prn  . Degenerative disc disease     L4-5, uses aleve  . History of chest pain 2009    had stress test in [facility]-requested copy of records-no cardiac findings per pt  . Degenerative disk disease   . PONV (postoperative nausea and vomiting)     Past Surgical History  Procedure Date  . Conization of cervix     age 19  . Abdominal hysterectomy 04/10/2011  . Laparoscopic assisted vaginal hysterectomy 04/10/2011    Procedure: LAPAROSCOPIC ASSISTED VAGINAL HYSTERECTOMY;  Surgeon: James E Tomblin II;  Location: [facility];  Service: Gynecology;  Laterality: N/A;  . Salpingoophorectomy 04/10/2011    Procedure: SALPINGO OOPHERECTOMY;  Surgeon: James E Tomblin II;  Location: [facility];  Service: Gynecology;  Laterality: Bilateral;  . Anterior and posterior repair 04/10/2011    Procedure: ANTERIOR (CYSTOCELE) AND POSTERIOR REPAIR (RECTOCELE);  Surgeon: James E Tomblin II;  Location: [facility];  Service: Gynecology;  Laterality: N/A;  Sacrospinous Ligament Suspension    History reviewed. No pertinent family history.  Social History History  Substance Use Topics  . Smoking status: Current Every Day  Smoker -- 1.0 packs/day for 31 years    Types: Cigarettes  . Smokeless tobacco: Never Used  . Alcohol Use: Yes    No Known Allergies  No current outpatient prescriptions on file.    Review of Systems Review of Systems  Constitutional: Negative for fever, chills and unexpected weight change.  HENT: Negative for hearing loss, congestion, sore throat, trouble swallowing and voice change.   Eyes: Negative for visual disturbance.  Respiratory: Negative for cough and wheezing.   Cardiovascular: Negative for chest pain, palpitations and leg swelling.  Gastrointestinal: Negative for nausea, vomiting, abdominal pain, diarrhea, constipation, blood in stool, abdominal distention and anal bleeding.  Genitourinary: Negative for hematuria, vaginal bleeding and difficulty urinating.  Musculoskeletal: Negative for arthralgias.  Skin: Negative for rash and wound.  Neurological: Negative for seizures, syncope and headaches.  Hematological: Negative for adenopathy. Does not bruise/bleed easily.  Psychiatric/Behavioral: Negative for confusion.    Blood pressure 100/62, pulse 64, temperature 97.2 F (36.2 C), temperature source Temporal, resp. rate 20, height 5' 5" (1.651 m), weight 150 lb 9.6 oz (68.312 kg).  Physical Exam Physical Exam  Constitutional: She appears well-developed and well-nourished.  HENT:  Head: Normocephalic and atraumatic.  Eyes: EOM are normal. Pupils are equal, round, and reactive to light.  Neck: Normal range of motion. Neck supple.  Cardiovascular: Normal rate and regular rhythm.   Pulmonary/Chest: Effort normal and breath sounds normal.       Right nipple retracted   with white discharge. No masses in either breast. Both axilla normal. .  Skin: Skin is warm and dry.  Psychiatric: She has a normal mood and affect. Her behavior is normal. Judgment and thought content normal.    Data Reviewed Mammogram shows a punctate calcifications right breast upper outer quadrant. 2  foci were biopsied showing atypical ductal hyperplasia. MRI shows a diffuse nodularity to both breasts which was nonspecific. Biopsy results showed atypical ductal hyperplasia  Clinical Data: Right nipple retraction. History of biopsy of the  upper outer right breast with pathology revealing atypical ductal  hyperplasia. The patient has yet to undergo excision of the  lesion. Of note, there are two separate areas of calcifications in  the upper outer right breast for which bracketing at the time of  excision was recommended.  DIGITAL DIAGNOSTIC RIGHT MAMMOGRAM WITH CAD  Comparison: 05/17/2011.  Findings: CC and MLO view of the right breast were obtained.  Heterogeneously dense breast tissue, unchanged. Calcifications in  the upper outer quadrant of the right breast which have not  significantly changed since the prior examination. No new  suspicious mass, architectural distortion, or new suspicious  calcifications in the right breast.  Mammographic images were processed with CAD.  IMPRESSION:  Calcifications in the upper outer quadrant of the right breast  which has previously been biopsied, showing atypical ductal  hyperplasia.  RECOMMENDATION:  Excision of the calcifications in the upper outer quadrant of the  right breast. As mentioned on prior examinations, bracketing of  the two groups of calcifications will be necessary at the time of  excision.  The patient may also benefit from repeat MRI of the breasts, in  light of the new clinical findings and the findings identified on  the prior examination.  I had an extensive discussion with the the patient regarding the  need for excision, particularly in light of the new finding of  nipple retraction. She voiced understanding of this need, though I  am unsure if she completely grasped the urgency.  BI-RADS CATEGORY 4: Suspicious abnormality - biopsy should be  considered.  Cc: Dr. James Tomblin, Dr. Xia Stohr   Assessment     Right breast atypical ductal hyperplasia    Plan    I had a lengthy discussion with the patient about options. I recommend excision of these areas of microcalcifications in the right breast using needle localization. The areas would have to be bracketed. She is unsure if she wishes to do this. I explained there is a possible chance there could be breast cancer within this area despite her core biopsy results. I recommended excision of these areas AGAIN AND SHE IS IN AGREEMENT.The procedure has been discussed with the patient. Alternatives to surgery have been discussed with the patient.  Risks of surgery include bleeding,  Infection,  Seroma formation, death,  and the need for further surgery.   The patient understands and wishes to proceed.       Lahari Suttles A. 05/05/2012, 3:35 PM    

## 2012-05-05 NOTE — Patient Instructions (Signed)
Lumpectomy, Breast Conserving Surgery A lumpectomy is breast surgery that removes only part of the breast. Another name used may be partial mastectomy. The amount removed varies. Make sure you understand how much of your breast will be removed. Reasons for a lumpectomy:  Any solid breast mass.  Grouped significant nodularity that may be confused with a solitary breast mass. Lumpectomy is the most common form of breast cancer surgery today. The surgeon removes the portion of your breast which contains the tumor (cancer). This is the lump. Some normal tissue around the lump is also removed to be sure that all the tumor has been removed.  If cancer cells are found in the margins where the breast tissue was removed, your surgeon will do more surgery to remove the remaining cancer tissue. This is called re-excision surgery. Radiation and/or chemotherapy treatments are often given following a lumpectomy to kill any cancer cells that could possibly remain.  REASONS YOU MAY NOT BE ABLE TO HAVE BREAST CONSERVING SURGERY:  The tumor is located in more than one place.  Your breast is small and the tumor is large so the breast would be disfigured.  The entire tumor removal is not successful with a lumpectomy.  You cannot commit to a full course of chemotherapy, radiation therapy or are pregnant and cannot have radiation.  You have previously had radiation to the breast to treat cancer. HOW A LUMPECTOMY IS PERFORMED If overnight nursing is not required following a biopsy, a lumpectomy can be performed as a same-day surgery. This can be done in a hospital, clinic, or surgical center. The anesthesia used will depend on your surgeon. They will discuss this with you. A general anesthetic keeps you sleeping through the procedure. LET YOUR CAREGIVERS KNOW ABOUT THE FOLLOWING:  Allergies  Medications taken including herbs, eye drops, over the counter medications, and creams.  Use of steroids (by mouth or  creams)  Previous problems with anesthetics or Novocaine.  Possibility of pregnancy, if this applies  History of blood clots (thrombophlebitis)  History of bleeding or blood problems.  Previous surgery  Other health problems BEFORE THE PROCEDURE You should be present one hour prior to your procedure unless directed otherwise.  AFTER THE PROCEDURE  After surgery, you will be taken to the recovery area where a nurse will watch and check your progress. Once you're awake, stable, and taking fluids well, barring other problems you will be allowed to go home.  Ice packs applied to your operative site may help with discomfort and keep the swelling down.  A small rubber drain may be placed in the breast for a couple of days to prevent a hematoma from developing in the breast.  A pressure dressing may be applied for 24 to 48 hours to prevent bleeding.  Keep the wound dry.  You may resume a normal diet and activities as directed. Avoid strenuous activities affecting the arm on the side of the biopsy site such as tennis, swimming, heavy lifting (more than 10 pounds) or pulling.  Bruising in the breast is normal following this procedure.  Wearing a bra - even to bed - may be more comfortable and also help keep the dressing on.  Change dressings as directed.  Only take over-the-counter or prescription medicines for pain, discomfort, or fever as directed by your caregiver. Call for your results as instructed by your surgeon. Remember it is your responsibility to get the results of your lumpectomy if your surgeon asked you to follow-up. Do not assume   everything is fine if you have not heard from your caregiver. SEEK MEDICAL CARE IF:   There is increased bleeding (more than a small spot) from the wound.  You notice redness, swelling, or increasing pain in the wound.  Pus is coming from wound.  An unexplained oral temperature above 102 F (38.9 C) develops.  You notice a foul smell  coming from the wound or dressing. SEEK IMMEDIATE MEDICAL CARE IF:   You develop a rash.  You have difficulty breathing.  You have any allergic problems. Document Released: 08/13/2006 Document Revised: 09/24/2011 Document Reviewed: 11/14/2006 [employer] Patient Information 2013 [employer].  

## 2012-05-06 ENCOUNTER — Encounter (HOSPITAL_BASED_OUTPATIENT_CLINIC_OR_DEPARTMENT_OTHER): Payer: Self-pay | Admitting: *Deleted

## 2012-05-06 NOTE — Progress Notes (Signed)
Dr Luisa Hart ordered cxr-cmet-pt lives and works [employer] come today-will come here 12n to get lab and cxr then go to [facility] at 1300 for nl-then dsc

## 2012-05-07 ENCOUNTER — Encounter (HOSPITAL_BASED_OUTPATIENT_CLINIC_OR_DEPARTMENT_OTHER)
Admission: RE | Disposition: A | Discharge status: Home / Self Care | Payer: Self-pay | Source: Ambulatory Visit | Attending: Surgery

## 2012-05-07 ENCOUNTER — Encounter (HOSPITAL_BASED_OUTPATIENT_CLINIC_OR_DEPARTMENT_OTHER): Payer: Self-pay | Admitting: *Deleted

## 2012-05-07 ENCOUNTER — Ambulatory Visit (HOSPITAL_BASED_OUTPATIENT_CLINIC_OR_DEPARTMENT_OTHER): Payer: BC Managed Care – PPO | Admitting: Certified Registered"

## 2012-05-07 ENCOUNTER — Encounter (HOSPITAL_BASED_OUTPATIENT_CLINIC_OR_DEPARTMENT_OTHER)
Admission: RE | Admit: 2012-05-07 | Discharge: 2012-05-07 | Disposition: A | Discharge status: Home / Self Care | Payer: BC Managed Care – PPO | Source: Ambulatory Visit | Attending: Surgery | Admitting: Surgery

## 2012-05-07 ENCOUNTER — Ambulatory Visit (HOSPITAL_BASED_OUTPATIENT_CLINIC_OR_DEPARTMENT_OTHER)
Admission: RE | Admit: 2012-05-07 | Discharge: 2012-05-07 | Disposition: A | Discharge status: Home / Self Care | Payer: BC Managed Care – PPO | Source: Ambulatory Visit | Attending: Surgery | Admitting: Surgery

## 2012-05-07 ENCOUNTER — Ambulatory Visit
Admission: RE | Admit: 2012-05-07 | Discharge: 2012-05-07 | Disposition: A | Discharge status: Home / Self Care | Payer: BC Managed Care – PPO | Source: Ambulatory Visit | Attending: Surgery | Admitting: Surgery

## 2012-05-07 ENCOUNTER — Encounter (HOSPITAL_BASED_OUTPATIENT_CLINIC_OR_DEPARTMENT_OTHER): Payer: Self-pay | Admitting: Certified Registered"

## 2012-05-07 DIAGNOSIS — N6019 Diffuse cystic mastopathy of unspecified breast: Secondary | ICD-10-CM

## 2012-05-07 DIAGNOSIS — N6099 Unspecified benign mammary dysplasia of unspecified breast: Secondary | ICD-10-CM

## 2012-05-07 DIAGNOSIS — F17200 Nicotine dependence, unspecified, uncomplicated: Secondary | ICD-10-CM | POA: Insufficient documentation

## 2012-05-07 DIAGNOSIS — N6089 Other benign mammary dysplasias of unspecified breast: Secondary | ICD-10-CM | POA: Insufficient documentation

## 2012-05-07 DIAGNOSIS — K219 Gastro-esophageal reflux disease without esophagitis: Secondary | ICD-10-CM | POA: Insufficient documentation

## 2012-05-07 HISTORY — PX: BREAST SURGERY: SHX581

## 2012-05-07 LAB — CBC WITH DIFFERENTIAL/PLATELET
Basophils Absolute: 0 10*3/uL (ref 0.0–0.1)
Basophils Relative: 1 % (ref 0–1)
Eosinophils Absolute: 0.1 10*3/uL (ref 0.0–0.7)
Hemoglobin: 14.5 g/dL (ref 12.0–15.0)
MCHC: 34.6 g/dL (ref 30.0–36.0)
Neutro Abs: 5 10*3/uL (ref 1.7–7.7)
Neutrophils Relative %: 63 % (ref 43–77)
Platelets: 186 10*3/uL (ref 150–400)
RDW: 13.2 % (ref 11.5–15.5)

## 2012-05-07 LAB — COMPREHENSIVE METABOLIC PANEL
ALT: 14 U/L (ref 0–35)
CO2: 28 mEq/L (ref 19–32)
Calcium: 9.4 mg/dL (ref 8.4–10.5)
Chloride: 103 mEq/L (ref 96–112)
GFR calc Af Amer: >90 mL/min (ref >90)
GFR calc non Af Amer: >90 mL/min (ref >90)
Glucose, Bld: 87 mg/dL (ref 70–99)
Sodium: 139 mEq/L (ref 135–145)
Total Bilirubin: 0.4 mg/dL (ref 0.3–1.2)

## 2012-05-07 SURGERY — PARTIAL MASTECTOMY WITH NEEDLE LOCALIZATION
Anesthesia: General | Site: Breast | Laterality: Right | Wound class: Clean

## 2012-05-07 MED ORDER — LACTATED RINGERS IV SOLN
INTRAVENOUS | Status: DC
  Administered 2012-05-07 (×2): via INTRAVENOUS

## 2012-05-07 MED ORDER — DEXAMETHASONE SODIUM PHOSPHATE 4 MG/ML IJ SOLN
INTRAMUSCULAR | Status: DC | PRN
  Administered 2012-05-07: 10 mg via INTRAVENOUS

## 2012-05-07 MED ORDER — BUPIVACAINE-EPINEPHRINE 0.25% -1:200000 IJ SOLN
INTRAMUSCULAR | Status: DC | PRN
  Administered 2012-05-07: 20 mL

## 2012-05-07 MED ORDER — MIDAZOLAM HCL 5 MG/5ML IJ SOLN
INTRAMUSCULAR | Status: DC | PRN
  Administered 2012-05-07: 1 mg via INTRAVENOUS

## 2012-05-07 MED ORDER — CHLORHEXIDINE GLUCONATE 4 % EX LIQD: 1.0000 "application " | Freq: Once | CUTANEOUS | Status: DC

## 2012-05-07 MED ORDER — DEXTROSE 5 % IV SOLN
3.0000 g | INTRAVENOUS | Status: AC
  Administered 2012-05-07: 3 g via INTRAVENOUS

## 2012-05-07 MED ORDER — LIDOCAINE HCL (CARDIAC) 20 MG/ML IV SOLN
INTRAVENOUS | Status: DC | PRN
  Administered 2012-05-07: 80 mg via INTRAVENOUS

## 2012-05-07 MED ORDER — ONDANSETRON HCL 4 MG/2ML IJ SOLN
INTRAMUSCULAR | Status: DC | PRN
  Administered 2012-05-07: 4 mg via INTRAVENOUS

## 2012-05-07 MED ORDER — OXYCODONE HCL 5 MG PO TABS
5.0000 mg | ORAL_TABLET | Freq: Once | ORAL | Status: AC | PRN
  Administered 2012-05-07: 5 mg via ORAL

## 2012-05-07 MED ORDER — FENTANYL CITRATE 0.05 MG/ML IJ SOLN
INTRAMUSCULAR | Status: DC | PRN
  Administered 2012-05-07 (×2): 50 ug via INTRAVENOUS

## 2012-05-07 MED ORDER — OXYCODONE HCL 5 MG/5ML PO SOLN: 5.0000 mg | Freq: Once | ORAL | Status: AC | PRN

## 2012-05-07 MED ORDER — OXYCODONE-ACETAMINOPHEN 5-325 MG PO TABS
1.0000 | ORAL_TABLET | ORAL | Status: DC | PRN
Start: 2012-05-07 — End: 2012-07-31

## 2012-05-07 MED ORDER — ONDANSETRON HCL 4 MG/2ML IJ SOLN: 4.0000 mg | Freq: Four times a day (QID) | INTRAMUSCULAR | Status: DC | PRN

## 2012-05-07 MED ORDER — HYDROMORPHONE HCL PF 1 MG/ML IJ SOLN
0.2500 mg | INTRAMUSCULAR | Status: DC | PRN
  Administered 2012-05-07 (×2): 0.5 mg via INTRAVENOUS

## 2012-05-07 MED ORDER — PROPOFOL 10 MG/ML IV BOLUS
INTRAVENOUS | Status: DC | PRN
  Administered 2012-05-07: 200 mg via INTRAVENOUS

## 2012-05-07 SURGICAL SUPPLY — 46 items
ADH SKN CLS APL DERMABOND .7 (GAUZE/BANDAGES/DRESSINGS) ×1
BLADE SURG 15 STRL LF DISP TIS (BLADE) ×1 IMPLANT
BLADE SURG 15 STRL SS (BLADE) ×2
CANISTER SUCTION 1200CC (MISCELLANEOUS) ×1 IMPLANT
CHLORAPREP W/TINT 26ML (MISCELLANEOUS) ×2 IMPLANT
CLIP TI WIDE RED SMALL 6 (CLIP) IMPLANT
CLOTH BEACON ORANGE TIMEOUT ST (SAFETY) ×2 IMPLANT
COVER MAYO STAND STRL (DRAPES) ×2 IMPLANT
COVER TABLE BACK 60X90 (DRAPES) ×2 IMPLANT
DECANTER SPIKE VIAL GLASS SM (MISCELLANEOUS) IMPLANT
DERMABOND ADVANCED (GAUZE/BANDAGES/DRESSINGS) ×1
DERMABOND ADVANCED .7 DNX12 (GAUZE/BANDAGES/DRESSINGS) ×1 IMPLANT
DEVICE DUBIN W/COMP PLATE 8390 (MISCELLANEOUS) ×1 IMPLANT
DRAPE LAPAROSCOPIC ABDOMINAL (DRAPES) IMPLANT
DRAPE PED LAPAROTOMY (DRAPES) ×2 IMPLANT
DRAPE UTILITY XL STRL (DRAPES) ×2 IMPLANT
ELECT COATED BLADE 2.86 ST (ELECTRODE) ×2 IMPLANT
ELECT REM PT RETURN 9FT ADLT (ELECTROSURGICAL) ×2
ELECTRODE REM PT RTRN 9FT ADLT (ELECTROSURGICAL) ×1 IMPLANT
GLOVE BIOGEL M 7.0 STRL (GLOVE) ×2 IMPLANT
GLOVE BIOGEL PI IND STRL 7.0 (GLOVE) IMPLANT
GLOVE BIOGEL PI IND STRL 8 (GLOVE) ×1 IMPLANT
GLOVE BIOGEL PI INDICATOR 7.0 (GLOVE) ×1
GLOVE BIOGEL PI INDICATOR 8 (GLOVE) ×1
GLOVE ECLIPSE 8.0 STRL XLNG CF (GLOVE) ×3 IMPLANT
GOWN PREVENTION PLUS XLARGE (GOWN DISPOSABLE) ×4 IMPLANT
HEMOSTAT SURGICEL 2X14 (HEMOSTASIS) ×1 IMPLANT
KIT MARKER MARGIN INK (KITS) ×1 IMPLANT
NDL HYPO 25X1 1.5 SAFETY (NEEDLE) ×1 IMPLANT
NEEDLE HYPO 25X1 1.5 SAFETY (NEEDLE) ×2 IMPLANT
NS IRRIG 1000ML POUR BTL (IV SOLUTION) ×2 IMPLANT
PACK BASIN DAY SURGERY FS (CUSTOM PROCEDURE TRAY) ×2 IMPLANT
PENCIL BUTTON HOLSTER BLD 10FT (ELECTRODE) ×2 IMPLANT
SLEEVE SCD COMPRESS KNEE MED (MISCELLANEOUS) ×2 IMPLANT
SPONGE LAP 4X18 X RAY DECT (DISPOSABLE) ×2 IMPLANT
STAPLER VISISTAT 35W (STAPLE) IMPLANT
SUT MON AB 4-0 PC3 18 (SUTURE) ×2 IMPLANT
SUT SILK 2 0 SH (SUTURE) IMPLANT
SUT VIC AB 3-0 SH 27 (SUTURE) ×2
SUT VIC AB 3-0 SH 27X BRD (SUTURE) ×1 IMPLANT
SYR CONTROL 10ML LL (SYRINGE) ×2 IMPLANT
TOWEL OR 17X24 6PK STRL BLUE (TOWEL DISPOSABLE) ×3 IMPLANT
TOWEL OR NON WOVEN STRL DISP B (DISPOSABLE) ×2 IMPLANT
TUBE CONNECTING 20X1/4 (TUBING) ×2 IMPLANT
WATER STERILE IRR 1000ML POUR (IV SOLUTION) IMPLANT
YANKAUER SUCT BULB TIP NO VENT (SUCTIONS) ×2 IMPLANT

## 2012-05-07 NOTE — Anesthesia Procedure Notes (Signed)
Procedure Name: LMA Insertion Date/Time: 05/07/2012 2:46 PM Performed by: Meyer Russel Pre-anesthesia Checklist: Patient identified, Emergency Drugs available, Suction available and Patient being monitored Patient Re-evaluated:Patient Re-evaluated prior to inductionOxygen Delivery Method: Circle System Utilized Preoxygenation: Pre-oxygenation with 100% oxygen Intubation Type: IV induction Ventilation: Mask ventilation without difficulty LMA: LMA inserted LMA Size: 4.0 Number of attempts: 1 Airway Equipment and Method: bite block Placement Confirmation: positive ETCO2 and breath sounds checked- equal and bilateral Tube secured with: Tape Dental Injury: Teeth and Oropharynx as per pre-operative assessment

## 2012-05-07 NOTE — Op Note (Signed)
Breast Partial Mastectomy right  Indications: This patient presents with history of a right breast mass and ADH. Given the clinical history and physical exam, along with indicated diagnostic studies, breast biopsy will be performed.  Pre-operative Diagnosis: right breast mass with ADH  Post-operative Diagnosis: right breast mass with ADH  Surgeon: Evelia Waskey A.   Assistants: or STAFF  Anesthesia: General LMA anesthesia and Local anesthesia 0.25.% bupivacaine, with epinephrine  ASA Class: 2  Procedure Details  The patient was seen in the Holding Room. The risks, benefits, complications, treatment options, and expected outcomes were discussed with the patient. The possibilities of reaction to medication, pulmonary aspiration, bleeding, infection, the need for additional procedures, failure to diagnose a condition, and creating a complication requiring transfusion or operation were discussed with the patient. The patient concurred with the proposed plan, giving informed consent. The site of surgery properly noted/marked. The patient was taken to Operating Room, identified as Courtney Villarreal, and the procedure verified as lumpectomy. A Time Out was held and the above information confirmed.  After induction of anesthesia, the right breast and chest were prepped and draped in standard fashion. The lumpectomy was performed by creating an oblique incision over the upper outer quadrant  Where the wire exited  the breast. Hemostasis was achieved with cautery. The wound was irrigated and closed with a   3 O  Vicryl and 4 0 monocryl  subcuticular closure in layers.  Specimen oriented.    Sterile dressings were applied. At the end of the operation, all sponge, instrument, and needle counts were correct.  Findings: grossly clear surgical margins  Estimated Blood Loss:  Minimal         Drains: NONE         Total IV Fluids:  2000 mL         Specimens: breast mass    With wires and clips           Complications:  None; patient tolerated the procedure well.         Disposition: PACU - hemodynamically stable.         Condition: Stable

## 2012-05-07 NOTE — Anesthesia Postprocedure Evaluation (Signed)
Anesthesia Post Note  Patient: Courtney Villarreal  Procedure(s) Performed: Procedure(s) (LRB): PARTIAL MASTECTOMY WITH NEEDLE LOCALIZATION (Right)  Anesthesia type: General  Patient location: PACU  Post pain: Pain level controlled and Adequate analgesia  Post assessment: Post-op Vital signs reviewed, Patient's Cardiovascular Status Stable, Respiratory Function Stable, Patent Airway and Pain level controlled  Last Vitals:  Filed Vitals:   05/07/12 1615  BP: 110/65  Pulse: 62  Temp:   Resp: 15    Post vital signs: Reviewed and stable  Level of consciousness: awake, alert  and oriented  Complications: No apparent anesthesia complications

## 2012-05-07 NOTE — Interval H&P Note (Signed)
History and Physical Interval Note:  05/07/2012 2:19 PM  Courtney Villarreal  has presented today for surgery, with the diagnosis of atypical ductal hyperplasia  The various methods of treatment have been discussed with the patient and family. After consideration of risks, benefits and other options for treatment, the patient has consented to  Procedure(s) (LRB) with comments: PARTIAL MASTECTOMY WITH NEEDLE LOCALIZATION (Right) as a surgical intervention .  The patient's history has been reviewed, patient examined, no change in status, stable for surgery.  I have reviewed the patient's chart and labs.  Questions were answered to the patient's satisfaction.     [name]

## 2012-05-07 NOTE — Transfer of Care (Signed)
Immediate Anesthesia Transfer of Care Note  Patient: Courtney Villarreal  Procedure(s) Performed: Procedure(s) (LRB) with comments: PARTIAL MASTECTOMY WITH NEEDLE LOCALIZATION (Right)  Patient Location: PACU  Anesthesia Type: General  Level of Consciousness: awake, alert  and oriented  Airway & Oxygen Therapy: Patient Spontanous Breathing and Patient connected to face mask oxygen  Post-op Assessment: Report given to PACU RN, Post -op Vital signs reviewed and stable and Patient moving all extremities  Post vital signs: Reviewed and stable  Complications: No apparent anesthesia complications

## 2012-05-07 NOTE — H&P (View-Only) (Signed)
Patient ID: Courtney Villarreal, female   DOB: 1960/10/20, 51 y.o.   MRN: [id number]  Chief Complaint  Patient presents with  . Pre-op Exam    eval nipple retraction    HPI Courtney Villarreal is a 51 y.o. female. HPI  The patient presents today at the request of Dr.  Lyman Bishop  do to an abnormal mammogram. She denies any history of breast pain, breast mass but has  nipple discharge. Her mammogram showed clusters of microcalcifications in the right breast upper-outer quadrant. There were 2 areas that underwent biopsy that showed atypical ductal hyperplasia.  She is noted complaints.  She was seen 1 year ago and decided against biopsy.  Now she has right nipple retraction and white discharge Past Medical History  Diagnosis Date  . Shortness of breath     smoker  . GERD (gastroesophageal reflux disease)     takes otc zantac prn  . Degenerative disc disease     L4-5, uses aleve  . History of chest pain 2009    had stress test in [facility]-requested copy of records-no cardiac findings per pt  . Degenerative disk disease   . PONV (postoperative nausea and vomiting)     Past Surgical History  Procedure Date  . Conization of cervix     age 74  . Abdominal hysterectomy 04/10/2011  . Laparoscopic assisted vaginal hysterectomy 04/10/2011    Procedure: LAPAROSCOPIC ASSISTED VAGINAL HYSTERECTOMY;  Surgeon: Roselle Locus II;  Location: [facility];  Service: Gynecology;  Laterality: N/A;  . Salpingoophorectomy 04/10/2011    Procedure: SALPINGO OOPHERECTOMY;  Surgeon: Roselle Locus II;  Location: [facility];  Service: Gynecology;  Laterality: Bilateral;  . Anterior and posterior repair 04/10/2011    Procedure: ANTERIOR (CYSTOCELE) AND POSTERIOR REPAIR (RECTOCELE);  Surgeon: Roselle Locus II;  Location: [facility];  Service: Gynecology;  Laterality: N/A;  Sacrospinous Ligament Suspension    History reviewed. No pertinent family history.  Social History History  Substance Use Topics  . Smoking status: Current Every Day  Smoker -- 1.0 packs/day for 31 years    Types: Cigarettes  . Smokeless tobacco: Never Used  . Alcohol Use: Yes    No Known Allergies  No current outpatient prescriptions on file.    Review of Systems Review of Systems  Constitutional: Negative for fever, chills and unexpected weight change.  HENT: Negative for hearing loss, congestion, sore throat, trouble swallowing and voice change.   Eyes: Negative for visual disturbance.  Respiratory: Negative for cough and wheezing.   Cardiovascular: Negative for chest pain, palpitations and leg swelling.  Gastrointestinal: Negative for nausea, vomiting, abdominal pain, diarrhea, constipation, blood in stool, abdominal distention and anal bleeding.  Genitourinary: Negative for hematuria, vaginal bleeding and difficulty urinating.  Musculoskeletal: Negative for arthralgias.  Skin: Negative for rash and wound.  Neurological: Negative for seizures, syncope and headaches.  Hematological: Negative for adenopathy. Does not bruise/bleed easily.  Psychiatric/Behavioral: Negative for confusion.    Blood pressure 100/62, pulse 64, temperature 97.2 F (36.2 C), temperature source Temporal, resp. rate 20, height 5\' 5"  (1.651 m), weight 150 lb 9.6 oz (68.312 kg).  Physical Exam Physical Exam  Constitutional: She appears well-developed and well-nourished.  HENT:  Head: Normocephalic and atraumatic.  Eyes: EOM are normal. Pupils are equal, round, and reactive to light.  Neck: Normal range of motion. Neck supple.  Cardiovascular: Normal rate and regular rhythm.   Pulmonary/Chest: Effort normal and breath sounds normal.       Right nipple retracted  with white discharge. No masses in either breast. Both axilla normal. .  Skin: Skin is warm and dry.  Psychiatric: She has a normal mood and affect. Her behavior is normal. Judgment and thought content normal.    Data Reviewed Mammogram shows a punctate calcifications right breast upper outer quadrant. 2  foci were biopsied showing atypical ductal hyperplasia. MRI shows a diffuse nodularity to both breasts which was nonspecific. Biopsy results showed atypical ductal hyperplasia  Clinical Data: Right nipple retraction. History of biopsy of the  upper outer right breast with pathology revealing atypical ductal  hyperplasia. The patient has yet to undergo excision of the  lesion. Of note, there are two separate areas of calcifications in  the upper outer right breast for which bracketing at the time of  excision was recommended.  DIGITAL DIAGNOSTIC RIGHT MAMMOGRAM WITH CAD  Comparison: 05/17/2011.  Findings: CC and MLO view of the right breast were obtained.  Heterogeneously dense breast tissue, unchanged. Calcifications in  the upper outer quadrant of the right breast which have not  significantly changed since the prior examination. No new  suspicious mass, architectural distortion, or new suspicious  calcifications in the right breast.  Mammographic images were processed with CAD.  IMPRESSION:  Calcifications in the upper outer quadrant of the right breast  which has previously been biopsied, showing atypical ductal  hyperplasia.  RECOMMENDATION:  Excision of the calcifications in the upper outer quadrant of the  right breast. As mentioned on prior examinations, bracketing of  the two groups of calcifications will be necessary at the time of  excision.  The patient may also benefit from repeat MRI of the breasts, in  light of the new clinical findings and the findings identified on  the prior examination.  I had an extensive discussion with the the patient regarding the  need for excision, particularly in light of the new finding of  nipple retraction. She voiced understanding of this need, though I  am unsure if she completely grasped the urgency.  BI-RADS CATEGORY 4: Suspicious abnormality - biopsy should be  considered.  Cc: Dr. Harold Hedge, Dr. Harriette Bouillon   Assessment     Right breast atypical ductal hyperplasia    Plan    I had a lengthy discussion with the patient about options. I recommend excision of these areas of microcalcifications in the right breast using needle localization. The areas would have to be bracketed. She is unsure if she wishes to do this. I explained there is a possible chance there could be breast cancer within this area despite her core biopsy results. I recommended excision of these areas AGAIN AND SHE IS IN AGREEMENT.The procedure has been discussed with the patient. Alternatives to surgery have been discussed with the patient.  Risks of surgery include bleeding,  Infection,  Seroma formation, death,  and the need for further surgery.   The patient understands and wishes to proceed.       Sabryna Lahm A. 05/05/2012, 3:35 PM

## 2012-05-07 NOTE — Anesthesia Preprocedure Evaluation (Addendum)
Anesthesia Evaluation  Patient identified by MRN, date of birth, ID band Patient awake    Reviewed: Allergy & Precautions, H&P , NPO status , Patient's Chart, lab work & pertinent test results  History of Anesthesia Complications (+) PONV  Airway Mallampati: II  Neck ROM: full    Dental   Pulmonary shortness of breath, Current Smoker,          Cardiovascular     Neuro/Psych    GI/Hepatic GERD-  ,  Endo/Other    Renal/GU      Musculoskeletal   Abdominal   Peds  Hematology   Anesthesia Other Findings   Reproductive/Obstetrics                           Anesthesia Physical Anesthesia Plan  ASA: II  Anesthesia Plan: General   Post-op Pain Management:    Induction: Intravenous  Airway Management Planned: LMA  Additional Equipment:   Intra-op Plan:   Post-operative Plan:   Informed Consent: I have reviewed the patients History and Physical, chart, labs and discussed the procedure including the risks, benefits and alternatives for the proposed anesthesia with the patient or authorized representative who has indicated his/her understanding and acceptance.     Plan Discussed with: CRNA and Surgeon  Anesthesia Plan Comments:         Anesthesia Quick Evaluation

## 2012-05-12 ENCOUNTER — Telehealth (INDEPENDENT_AMBULATORY_CARE_PROVIDER_SITE_OTHER): Payer: Self-pay

## 2012-05-12 NOTE — Telephone Encounter (Signed)
Called patient with benign path results. Patient understtod.

## 2012-05-26 ENCOUNTER — Encounter (INDEPENDENT_AMBULATORY_CARE_PROVIDER_SITE_OTHER): Payer: Self-pay | Admitting: Surgery

## 2012-05-26 ENCOUNTER — Ambulatory Visit (INDEPENDENT_AMBULATORY_CARE_PROVIDER_SITE_OTHER): Payer: BC Managed Care – PPO | Admitting: Surgery

## 2012-05-26 VITALS — BP 120/80 | HR 69 | Temp 98.0°F | Ht 65.0 in | Wt 153.0 lb

## 2012-05-26 DIAGNOSIS — Z9889 Other specified postprocedural states: Secondary | ICD-10-CM

## 2012-05-26 NOTE — Progress Notes (Signed)
Patient returns after partial mastectomy. Pathology showed FIBROCYSTIC CHANGE.  Exam: Wound is clean dry and intact.  Impression: Status post partial mastectomy for fibrocystic change  Plan: Return to work next week full activity. Resume yearly screening mammogram. Followup as needed.

## 2012-05-26 NOTE — Patient Instructions (Addendum)
Return to work 1 week.  Return to clinic as needed.    Fibrocystic Breast Changes Fibrocystic breast changes is a non-cancerous(benign) condition that about half of all women have at some time in their life. It is also called benign breast disease and mammary dysplasia. It may also be called fibrocystic breast disease, but it is not really a disease. It is a common condition that occurs when women go through the hormonal changes during their menstrual cycle, between the ages of 56 to 21. Menopausal women do not have this problem, unless they are on hormone therapy. It can affect one or both breasts. This is not a sign that you will later get cancer. CAUSES  Overgrowth of cells lining the milk ducts, or enlarged lobules in the breast, cause the breast duct to become blocked. The duct then fills up with fluid. This is like a small balloon filled with water. It is called a cyst. Over time, with repeated inflammation there is a tendency to form scar tissue. This scar tissue becomes the fibrous part of fibrocystic disease. The exact cause of this happening is not known, but it may be related to the female hormones, estrogen and progesterone. Heredity (genetics) may also be a factor in some cases. SYMPTOMS   Tenderness.  Swelling.  Rope-like feeling.  Lumpy breast, one or both sides.  Changes in the size of the breasts, before and after the menstrual period (larger before, smaller after).  Green or dark brown nipple discharge (not blood). Symptoms are usually worse before periods (menstrual cycle) and get better toward the end of menstruation. Usually, it is temporary minor discomfort. But some women have severe pain.  DIAGNOSIS  Check your breasts monthly. The best time to check your breasts is after your period. If you check them during your period, you are more likely to feel the normal glands enlarged, as a result of the hormonal changes that happen right before your period. If you do not have  menstrual periods, check your breasts the first day of every month. Become familiar with the way your own breasts feel. It is then easier to notice if there are changes, such as more tenderness, a new growth, change in breast size, or a change in a lump that has always been there. All breasts lumps need to be investigated, to rule out breast cancer. See your caregiver as soon as possible, if you find a lump. Most breast lumps are not cancerous. Excellent treatment is available for ones that are.  To make a diagnosis, your caregiver will examine your breasts and may recommend other tests, such as:  Mammogram (breast X-ray).  Ultrasound.  MRI (magnetic resonance imaging).  Removing fluid from the cyst with a fine needle, under local anesthesia (aspiration).  Taking a breast tissue sample (breast biopsy). Some questions your caregiver will ask are:  What was the date of your last period?  When did the lump show up?  Is there any discharge from your breast?  Is the breast tender or painful?  Are the symptoms in one or both breasts?  Has the lump changed in size from month-to-month? How long has it been present?  Any family history of breast problems?  Any past breast problems?  Any history of breast surgery?  Are you taking any medications?  When was your last mammogram, and where was it done? TREATMENT   Dietary changes help to prevent or reduce the symptoms of fibrocystic breast changes.  You may need to stop  consuming all foods that contain caffeine, such as chocolate, sodas, coffee, and tea.  Reducing sugar and fat in your diet may also help.  Decrease estrogen in your diet. Some sources include commercially raised meats which contain estrogen. Eliminate other natural estrogens.  Birth control pills can also make symptoms worse.  Natural progesterone cream, applied at a dose of 15 to 20 milligrams per day, from ovulation until a day or two before your period returns,  may help with returning to normal breast tissue over several months. Seek advice from your caregiver.  Over-the-counter pain pills may help, as recommended by your caregiver.  Danazol hormone (female-like hormone) is sometimes used. It may cause hair growth and acne.  Needle aspiration can be used, to remove fluid from the cyst.  Surgery may be needed, to remove a large, persistent, and tender cyst.  Evening primrose oil may help with the tenderness and pain. It has linolenic acid that women may not have enough of. HOME CARE INSTRUCTIONS   Examine your breasts after every menstrual period.  If you do not have menstrual periods, examine your breasts the first day of every month.  Wear a firm support bra, especially when exercising.  Decrease or avoid caffeine in your diet.  Decrease the fat and sugar in your diet.  Eat a balanced diet.  Try to see your caregiver after you have a menstrual period.  Before seeing your caregiver, make notes about:  When you have the symptoms.  What types of symptoms you are having.  Medications you are taking.  When and where your last mammogram was taken.  Past breast problems or breast surgery. SEEK MEDICAL CARE IF:   You have been diagnosed with fibrocystic breast changes, and you develop changes in your breast:  Discharge from the nipple, especially bloody discharge.  Pain in the breast that does not go away after your menstrual period.  New lumps or bumps in the breast.  Lumps in your armpit.  Your breast or breasts become enlarged, red, and painful.  You find an isolated lump, even if it is not tender.  You have questions about this condition that have not been answered. Document Released: 04/18/2006 Document Revised: 09/24/2011 Document Reviewed: 07/13/2009 [facility] Patient Information 2013 [place], [place].

## 2012-07-31 ENCOUNTER — Encounter (HOSPITAL_COMMUNITY): Payer: Self-pay | Admitting: Emergency Medicine

## 2012-07-31 ENCOUNTER — Emergency Department (HOSPITAL_COMMUNITY)
Admission: EM | Admit: 2012-07-31 | Discharge: 2012-07-31 | Disposition: A | Discharge status: Home / Self Care | Payer: BC Managed Care – PPO | Attending: Emergency Medicine | Admitting: Emergency Medicine

## 2012-07-31 ENCOUNTER — Emergency Department (HOSPITAL_COMMUNITY): Payer: BC Managed Care – PPO

## 2012-07-31 ENCOUNTER — Encounter (INDEPENDENT_AMBULATORY_CARE_PROVIDER_SITE_OTHER): Payer: BC Managed Care – PPO | Admitting: General Surgery

## 2012-07-31 DIAGNOSIS — F17200 Nicotine dependence, unspecified, uncomplicated: Secondary | ICD-10-CM | POA: Insufficient documentation

## 2012-07-31 DIAGNOSIS — Z9889 Other specified postprocedural states: Secondary | ICD-10-CM | POA: Insufficient documentation

## 2012-07-31 DIAGNOSIS — R079 Chest pain, unspecified: Secondary | ICD-10-CM

## 2012-07-31 DIAGNOSIS — K219 Gastro-esophageal reflux disease without esophagitis: Secondary | ICD-10-CM | POA: Insufficient documentation

## 2012-07-31 DIAGNOSIS — M549 Dorsalgia, unspecified: Secondary | ICD-10-CM | POA: Insufficient documentation

## 2012-07-31 DIAGNOSIS — M25511 Pain in right shoulder: Secondary | ICD-10-CM

## 2012-07-31 DIAGNOSIS — M25519 Pain in unspecified shoulder: Secondary | ICD-10-CM | POA: Insufficient documentation

## 2012-07-31 DIAGNOSIS — IMO0002 Reserved for concepts with insufficient information to code with codable children: Secondary | ICD-10-CM | POA: Insufficient documentation

## 2012-07-31 MED ORDER — IBUPROFEN 400 MG PO TABS
600.0000 mg | ORAL_TABLET | Freq: Once | ORAL | Status: AC
  Administered 2012-07-31: 600 mg via ORAL
  Filled 2012-07-31: qty 1

## 2012-07-31 MED ORDER — NAPROXEN 500 MG PO TABS: 500.0000 mg | ORAL_TABLET | Freq: Two times a day (BID) | ORAL | Status: DC | PRN

## 2012-07-31 MED ORDER — OXYCODONE-ACETAMINOPHEN 5-325 MG PO TABS
2.0000 | ORAL_TABLET | Freq: Once | ORAL | Status: AC
  Administered 2012-07-31: 2 via ORAL
  Filled 2012-07-31: qty 2

## 2012-07-31 MED ORDER — OXYCODONE-ACETAMINOPHEN 5-325 MG PO TABS: 1.0000 | ORAL_TABLET | ORAL | Status: DC | PRN

## 2012-07-31 MED ORDER — DIAZEPAM 5 MG PO TABS
5.0000 mg | ORAL_TABLET | Freq: Once | ORAL | Status: AC
  Administered 2012-07-31: 5 mg via ORAL
  Filled 2012-07-31: qty 1

## 2012-07-31 NOTE — ED Notes (Signed)
Rt breast pain started early am  Hurts into back . Had surgery on that breast in oct  To remove fibroids had appointment at 330 with her breast dr but came here . No injury  Had foot surgery nov 15 hurts to lift arm she states

## 2012-07-31 NOTE — ED Notes (Addendum)
Pt has had fibroid removed "the size of a golf ball" and onset of pain began today to right breast that radiates to right upper arm and right shoulder blade. A&Ox4, ambulatory, stable. Pt requests a CT scan of right breast.

## 2012-07-31 NOTE — ED Notes (Signed)
No redness or swelling noted at surgical  Rt breast site

## 2012-07-31 NOTE — ED Provider Notes (Signed)
History    52 year old female with right sided chest pain, right shoulder and right upper back pain. Gradual onset around 3:00 this morning. Patient is laying in bed when she first noticed it. Progressively worsening since then. Pain is worse range of motion of her right shoulder. No shortness of breath. No fevers or chills. No cough. No unusual leg pain or swelling. Patient recently had surgery to her right breast approximately 2 months. She states that she has been healing well from this since then. No rash. No fevers or chills. Denies recent trauma or overuse.  CSN: [id number]  Arrival date & time 07/31/12  1430   First MD Initiated Contact with Patient 07/31/12 1607      No chief complaint on file.   (Consider location/radiation/quality/duration/timing/severity/associated sxs/prior treatment) HPI  Past Medical History  Diagnosis Date  . Degenerative disc disease     L4-5, uses aleve  . History of chest pain 2009    had stress test in [facility]-requested copy of records-no cardiac findings per pt  . Degenerative disk disease   . PONV (postoperative nausea and vomiting)   . Shortness of breath     smoker  . GERD (gastroesophageal reflux disease)     takes otc zantac prn    Past Surgical History  Procedure Date  . Conization of cervix     age 19  . Abdominal hysterectomy 04/10/2011  . Laparoscopic assisted vaginal hysterectomy 04/10/2011    Procedure: LAPAROSCOPIC ASSISTED VAGINAL HYSTERECTOMY;  Surgeon: Roselle Locus II;  Location: [facility];  Service: Gynecology;  Laterality: N/A;  . Salpingoophorectomy 04/10/2011    Procedure: SALPINGO OOPHERECTOMY;  Surgeon: Roselle Locus II;  Location: [facility];  Service: Gynecology;  Laterality: Bilateral;  . Anterior and posterior repair 04/10/2011    Procedure: ANTERIOR (CYSTOCELE) AND POSTERIOR REPAIR (RECTOCELE);  Surgeon: Roselle Locus II;  Location: [facility];  Service: Gynecology;  Laterality: N/A;  Sacrospinous Ligament Suspension  .  Breast surgery 05/07/12    Rt partial mastectomy    No family history on file.  History  Substance Use Topics  . Smoking status: Current Every Day Smoker -- 1.0 packs/day for 31 years    Types: Cigarettes  . Smokeless tobacco: Never Used  . Alcohol Use: Yes    OB History    Grav Para Term Preterm Abortions TAB SAB Ect Mult Living                  Review of Systems  All systems reviewed and negative, other than as noted in HPI.   Allergies  Review of patient's allergies indicates no known allergies.  Home Medications   Current Outpatient Rx  Name  Route  Sig  Dispense  Refill  . MULTI-VITAMIN/MINERALS PO TABS   Oral   Take 1 tablet by mouth daily.         Marland Kitchen NAPROXEN 500 MG PO TABS   Oral   Take 1 tablet (500 mg total) by mouth 2 (two) times daily as needed.   20 tablet   0   . OXYCODONE-ACETAMINOPHEN 5-325 MG PO TABS   Oral   Take 1-2 tablets by mouth every 4 (four) hours as needed for pain.   15 tablet   0     BP 126/79  Pulse 62  Temp 98 F (36.7 C) (Oral)  Resp 14  SpO2 100%  Physical Exam  Nursing note and vitals reviewed. Constitutional: She appears well-developed and well-nourished. No distress.  HENT:  Head: Normocephalic and atraumatic.  Eyes: Conjunctivae normal are normal. Right eye exhibits no discharge. Left eye exhibits no discharge.  Neck: Neck supple.  Cardiovascular: Normal rate, regular rhythm and normal heart sounds.  Exam reveals no gallop and no friction rub.   No murmur heard. Pulmonary/Chest: Effort normal and breath sounds normal. No respiratory distress.       Surgical scar to lateral right breast is intact and without signs of infection. Her breast is nontender no concerning masses palpated. No axillary lesions. Right shoulder is grossly normal in appearance and symmetric as compared to the left. She has tenderness along the upper aspect of her right deltoid, her right trapezius and right scapular region. She reports that  her pain is worse with range of motion of her shoulder. She is neurovascularly intact distally.  Abdominal: Soft. She exhibits no distension. There is no tenderness.  Musculoskeletal: She exhibits no edema and no tenderness.  Neurological: She is alert.  Skin: Skin is warm and dry.  Psychiatric: She has a normal mood and affect. Her behavior is normal. Thought content normal.    ED Course  Procedures (including critical care time)  Labs Reviewed - No data to display Dg Chest 2 View  07/31/2012  *RADIOLOGY REPORT*  Clinical Data: Right chest pain, hypertension.  CHEST - 2 VIEW  Comparison: 05/07/2012  Findings: Mild peribronchial thickening.  No confluent opacity or effusion.  Heart is normal size.  No bony abnormality.  IMPRESSION: Mild bronchitic changes.   Original Report Authenticated By: Charlett Nose, M.D.    EKG:  Rhythm: Sinus bradycardia  Vent. rate 54 BPM PR interval 138 ms QRS duration 94 ms QT/QTc 390/369 ms ST segments: Normal    1. Right shoulder pain   2. Chest pain       MDM  52 year old female with right-sided chest, shoulder and upper back pain. Suspect musculoskeletal etiology given reproducibility with palpation and range of motion her shoulder. Fairly recent breast surgery the surgical site looks good. Low suspicion for infectious etiology. Doubt pulmonary embolism. Very atypical for ACS. Chest x-ray is unremarkable. Plan symptomatic treatment at this time. Return precautions discussed. Outpatient followup otherwise.       Raeford Razor, MD 07/31/12 [phone]

## 2012-08-01 ENCOUNTER — Encounter (INDEPENDENT_AMBULATORY_CARE_PROVIDER_SITE_OTHER): Payer: BC Managed Care – PPO | Admitting: General Surgery

## 2012-08-05 ENCOUNTER — Telehealth (INDEPENDENT_AMBULATORY_CARE_PROVIDER_SITE_OTHER): Payer: Self-pay | Admitting: General Surgery

## 2012-08-05 NOTE — Telephone Encounter (Signed)
Pt calling for advice on what to do next.  She had surgery in mid-October on her breast.  Recently having new pain, mostly in that arm.  She went to the ER and given pain meds and muscle relaxants, which has helped a bit.  Advised pt to contact her PCP to physically evaluate her pain and order any indicated tests.  If surgical referral is indicated, Dr. Luisa Hart will be glad to get her back in to be seen.  She understands and will comply.

## 2012-09-18 ENCOUNTER — Other Ambulatory Visit: Payer: Self-pay | Admitting: Gynecology

## 2012-09-18 ENCOUNTER — Other Ambulatory Visit (INDEPENDENT_AMBULATORY_CARE_PROVIDER_SITE_OTHER): Payer: Self-pay | Admitting: Surgery

## 2012-09-18 DIAGNOSIS — Z853 Personal history of malignant neoplasm of breast: Secondary | ICD-10-CM

## 2013-09-15 ENCOUNTER — Encounter (HOSPITAL_COMMUNITY): Payer: Self-pay | Admitting: Emergency Medicine

## 2013-09-15 ENCOUNTER — Emergency Department (HOSPITAL_COMMUNITY)
Admission: EM | Admit: 2013-09-15 | Discharge: 2013-09-15 | Disposition: A | Discharge status: Home / Self Care | Payer: BC Managed Care – PPO | Attending: Emergency Medicine | Admitting: Emergency Medicine

## 2013-09-15 ENCOUNTER — Ambulatory Visit (HOSPITAL_COMMUNITY)
Admit: 2013-09-15 | Discharge: 2013-09-15 | Disposition: A | Discharge status: Home / Self Care | Payer: BC Managed Care – PPO | Attending: Emergency Medicine | Admitting: Emergency Medicine

## 2013-09-15 DIAGNOSIS — R1011 Right upper quadrant pain: Secondary | ICD-10-CM | POA: Insufficient documentation

## 2013-09-15 DIAGNOSIS — R109 Unspecified abdominal pain: Secondary | ICD-10-CM

## 2013-09-15 DIAGNOSIS — R112 Nausea with vomiting, unspecified: Secondary | ICD-10-CM | POA: Insufficient documentation

## 2013-09-15 DIAGNOSIS — F17200 Nicotine dependence, unspecified, uncomplicated: Secondary | ICD-10-CM | POA: Insufficient documentation

## 2013-09-15 DIAGNOSIS — Z8739 Personal history of other diseases of the musculoskeletal system and connective tissue: Secondary | ICD-10-CM | POA: Insufficient documentation

## 2013-09-15 DIAGNOSIS — Z90710 Acquired absence of both cervix and uterus: Secondary | ICD-10-CM | POA: Insufficient documentation

## 2013-09-15 DIAGNOSIS — K219 Gastro-esophageal reflux disease without esophagitis: Secondary | ICD-10-CM | POA: Insufficient documentation

## 2013-09-15 DIAGNOSIS — R1013 Epigastric pain: Secondary | ICD-10-CM | POA: Insufficient documentation

## 2013-09-15 LAB — COMPREHENSIVE METABOLIC PANEL
ALBUMIN: 4.1 g/dL (ref 3.5–5.2)
ALT: 18 U/L (ref 0–35)
AST: 20 U/L (ref 0–37)
Alkaline Phosphatase: 75 U/L (ref 39–117)
BILIRUBIN TOTAL: 0.4 mg/dL (ref 0.3–1.2)
BUN: 25 mg/dL — ABNORMAL HIGH (ref 6–23)
CHLORIDE: 100 meq/L (ref 96–112)
CO2: 31 mEq/L (ref 19–32)
CREATININE: 0.66 mg/dL (ref 0.50–1.10)
Calcium: 9.9 mg/dL (ref 8.4–10.5)
GFR calc Af Amer: >90 mL/min (ref >90)
GFR calc non Af Amer: >90 mL/min (ref >90)
Glucose, Bld: 118 mg/dL — ABNORMAL HIGH (ref 70–99)
POTASSIUM: 4 meq/L (ref 3.7–5.3)
SODIUM: 143 meq/L (ref 137–147)
Total Protein: 7.8 g/dL (ref 6.0–8.3)

## 2013-09-15 LAB — URINALYSIS, ROUTINE W REFLEX MICROSCOPIC
BILIRUBIN URINE: NEGATIVE
GLUCOSE, UA: NEGATIVE mg/dL
KETONES UR: NEGATIVE mg/dL
Leukocytes, UA: NEGATIVE
Nitrite: NEGATIVE
PH: 7.5 (ref 5.0–8.0)
Protein, ur: NEGATIVE mg/dL
Specific Gravity, Urine: 1.02 (ref 1.005–1.030)
Urobilinogen, UA: 0.2 mg/dL (ref 0.0–1.0)

## 2013-09-15 LAB — CBC WITH DIFFERENTIAL/PLATELET
Basophils Absolute: 0 10*3/uL (ref 0.0–0.1)
Basophils Relative: 0 % (ref 0–1)
EOS ABS: 0 10*3/uL (ref 0.0–0.7)
EOS PCT: 0 % (ref 0–5)
HCT: 43.6 % (ref 36.0–46.0)
Hemoglobin: 14.9 g/dL (ref 12.0–15.0)
LYMPHS ABS: 1.1 10*3/uL (ref 0.7–4.0)
Lymphocytes Relative: 9 % — ABNORMAL LOW (ref 12–46)
MCH: 31.8 pg (ref 26.0–34.0)
MCHC: 34.2 g/dL (ref 30.0–36.0)
MCV: 93.2 fL (ref 78.0–100.0)
MONOS PCT: 5 % (ref 3–12)
Monocytes Absolute: 0.7 10*3/uL (ref 0.1–1.0)
Neutro Abs: 11.1 10*3/uL — ABNORMAL HIGH (ref 1.7–7.7)
Neutrophils Relative %: 86 % — ABNORMAL HIGH (ref 43–77)
PLATELETS: 214 10*3/uL (ref 150–400)
RBC: 4.68 MIL/uL (ref 3.87–5.11)
RDW: 13.2 % (ref 11.5–15.5)
WBC: 12.9 10*3/uL — ABNORMAL HIGH (ref 4.0–10.5)

## 2013-09-15 LAB — URINE MICROSCOPIC-ADD ON

## 2013-09-15 LAB — LIPASE, BLOOD: LIPASE: 26 U/L (ref 11–59)

## 2013-09-15 MED ORDER — ONDANSETRON HCL 4 MG/2ML IJ SOLN
4.0000 mg | Freq: Once | INTRAMUSCULAR | Status: AC
  Administered 2013-09-15: 4 mg via INTRAVENOUS
  Filled 2013-09-15: qty 2

## 2013-09-15 MED ORDER — ONDANSETRON 4 MG PO TBDP: ORAL_TABLET | ORAL | Status: DC

## 2013-09-15 MED ORDER — PANTOPRAZOLE SODIUM 20 MG PO TBEC: 20.0000 mg | DELAYED_RELEASE_TABLET | Freq: Every day | ORAL | Status: DC

## 2013-09-15 MED ORDER — HYDROCODONE-ACETAMINOPHEN 5-325 MG PO TABS: 1.0000 | ORAL_TABLET | Freq: Four times a day (QID) | ORAL | Status: DC | PRN

## 2013-09-15 MED ORDER — MORPHINE SULFATE 4 MG/ML IJ SOLN
4.0000 mg | Freq: Once | INTRAMUSCULAR | Status: AC
  Administered 2013-09-15: 4 mg via INTRAVENOUS
  Filled 2013-09-15: qty 1

## 2013-09-15 MED ORDER — OXYCODONE-ACETAMINOPHEN 5-325 MG PO TABS: 1.0000 | ORAL_TABLET | ORAL | Status: DC | PRN

## 2013-09-15 NOTE — ED Notes (Signed)
Pt c/o abd pain with n/v.  

## 2013-09-15 NOTE — Discharge Instructions (Signed)

## 2013-09-15 NOTE — ED Provider Notes (Signed)
CSN: [id number]     Arrival date & time 09/15/13  0002 History   First MD Initiated Contact with Patient 09/15/13 0149     Chief Complaint  Patient presents with  . Abdominal Pain     Patient is a 53 y.o. female presenting with abdominal pain. The history is provided by the patient.  Abdominal Pain Pain location:  RUQ Pain quality: sharp   Pain radiates to:  Does not radiate Pain severity:  Moderate Onset quality:  Gradual Duration: several hours ago. Timing:  Constant Progression:  Worsening Chronicity:  New Relieved by:  Nothing Associated symptoms: no chest pain, no diarrhea, no dysuria, no fever and no shortness of breath   pt reports she had onset of abd pain earlier in the evening.  It is located in RUQ and epigastric region.  She reports episodes of nonbloody nausea/vomiting.  No diarrhea No cp/sob No fever  She admits to having pain recently after eating that radiates into her shoulder blades.    Past Medical History  Diagnosis Date  . Degenerative disc disease     L4-5, uses aleve  . History of chest pain 2009    had stress test in [facility]-requested copy of records-no cardiac findings per pt  . Degenerative disk disease   . PONV (postoperative nausea and vomiting)   . Shortness of breath     smoker  . GERD (gastroesophageal reflux disease)     takes otc zantac prn   Past Surgical History  Procedure Laterality Date  . Conization of cervix      age 39  . Abdominal hysterectomy  04/10/2011  . Laparoscopic assisted vaginal hysterectomy  04/10/2011    Procedure: LAPAROSCOPIC ASSISTED VAGINAL HYSTERECTOMY;  Surgeon: Shon Millet II;  Location: [facility];  Service: Gynecology;  Laterality: N/A;  . Salpingoophorectomy  04/10/2011    Procedure: SALPINGO OOPHERECTOMY;  Surgeon: Shon Millet II;  Location: [facility];  Service: Gynecology;  Laterality: Bilateral;  . Anterior and posterior repair  04/10/2011    Procedure: ANTERIOR (CYSTOCELE) AND POSTERIOR REPAIR (RECTOCELE);   Surgeon: Shon Millet II;  Location: [facility];  Service: Gynecology;  Laterality: N/A;  Sacrospinous Ligament Suspension  . Breast surgery  05/07/12    Rt partial mastectomy   History reviewed. No pertinent family history. History  Substance Use Topics  . Smoking status: Current Every Day Smoker -- 1.00 packs/day for 31 years    Types: Cigarettes  . Smokeless tobacco: Never Used  . Alcohol Use: No   OB History   Grav Para Term Preterm Abortions TAB SAB Ect Mult Living                 Review of Systems  Constitutional: Negative for fever.  Respiratory: Negative for shortness of breath.   Cardiovascular: Negative for chest pain.  Gastrointestinal: Positive for abdominal pain. Negative for diarrhea and blood in stool.  Genitourinary: Negative for dysuria.  All other systems reviewed and are negative.      Allergies  Review of patient's allergies indicates no known allergies.  Home Medications   Current Outpatient Rx  Name  Route  Sig  Dispense  Refill  . Multiple Vitamins-Minerals (MULTIVITAMIN WITH MINERALS) tablet   Oral   Take 1 tablet by mouth daily.         Marland Kitchen oxyCODONE-acetaminophen (PERCOCET/ROXICET) 5-325 MG per tablet   Oral   Take 1 tablet by mouth every 4 (four) hours as needed for severe pain.  15 tablet   0    BP 100/63  Pulse 70  Temp(Src) 98.3 F (36.8 C) (Oral)  Resp 16  Ht 5\' 4"  (1.626 m)  Wt 170 lb (77.111 kg)  BMI 29.17 kg/m2  SpO2 100% Physical Exam CONSTITUTIONAL: Well developed/well nourished HEAD: Normocephalic/atraumatic EYES: EOMI/PERRL, no icterus ENMT: Mucous membranes moist NECK: supple no meningeal signs SPINE:entire spine nontender CV: S1/S2 noted, no murmurs/rubs/gallops noted LUNGS: Lungs are clear to auscultation bilaterally, no apparent distress ABDOMEN: soft, moderate RUQ tenderness noted, no rebound or guarding GU:no cva tenderness NEURO: Pt is awake/alert, moves all extremitiesx4 EXTREMITIES: pulses normal, full  ROM SKIN: warm, color normal PSYCH: no abnormalities of mood noted  ED Course  Procedures   Pt improved in the ED No vomiting She is well appearing Her abd tenderness has improved  No RLQ tenderness to suggest appendicitis However her exam/history is suggestive of biliary colic  I gave patient option of being observed in the ED to have abdominal US in the morning or go home and come back for outpatient study She wishes to go home and come back for study in the morning I feel this is reasonable as she is well appearing/nontoxic, labs reassuring (except for leukocytosis) I don't feel she has acute cholecystitis currently Low suspicion for appendicitis or other acute abdominal process We discussed strict return precautions  BP 100/63  Pulse 70  Temp(Src) 98.3 F (36.8 C) (Oral)  Resp 16  Ht 5\' 4"  (1.626 m)  Wt 170 lb (77.111 kg)  BMI 29.17 kg/m2  SpO2 100%   Labs Review Labs Reviewed  COMPREHENSIVE METABOLIC PANEL - Abnormal; Notable for the following:    Glucose, Bld 118 (*)    BUN 25 (*)    All other components within normal limits  CBC WITH DIFFERENTIAL - Abnormal; Notable for the following:    WBC 12.9 (*)    Neutrophils Relative % 86 (*)    Neutro Abs 11.1 (*)    Lymphocytes Relative 9 (*)    All other components within normal limits  URINALYSIS, ROUTINE W REFLEX MICROSCOPIC - Abnormal; Notable for the following:    APPearance HAZY (*)    Hgb urine dipstick TRACE (*)    All other components within normal limits  URINE MICROSCOPIC-ADD ON - Abnormal; Notable for the following:    Squamous Epithelial / LPF MANY (*)    Bacteria, UA MANY (*)    All other components within normal limits  LIPASE, BLOOD     EKG Interpretation   Date/Time:  Tuesday September 15 2013 01:25:24 EST Ventricular Rate:  69 PR Interval:  134 QRS Duration: 76 QT Interval:  382 QTC Calculation: 409 R Axis:   59 Text Interpretation:  Normal sinus rhythm Possible Left atrial enlargement   Low voltage QRS Borderline ECG When compared with ECG of 31-Jul-2012  14:56, No significant change was found Confirmed by Christy Gentles  MD, Elenore Rota  [phone]) on 09/15/2013 1:49:40 AM      MDM   Final diagnoses:  Abdominal pain    Nursing notes including past medical history and social history reviewed and considered in documentation Labs/vital reviewed and considered     Sharyon Cable, MD 09/15/13 0600

## 2013-10-06 ENCOUNTER — Other Ambulatory Visit: Payer: Self-pay | Admitting: Gynecology

## 2013-10-06 ENCOUNTER — Other Ambulatory Visit (INDEPENDENT_AMBULATORY_CARE_PROVIDER_SITE_OTHER): Payer: Self-pay | Admitting: Surgery

## 2013-10-06 DIAGNOSIS — Z853 Personal history of malignant neoplasm of breast: Secondary | ICD-10-CM

## 2013-12-08 ENCOUNTER — Ambulatory Visit
Admission: RE | Admit: 2013-12-08 | Discharge: 2013-12-08 | Disposition: A | Discharge status: Home / Self Care | Payer: BC Managed Care – PPO | Source: Ambulatory Visit | Attending: Surgery | Admitting: Surgery

## 2013-12-08 ENCOUNTER — Other Ambulatory Visit (INDEPENDENT_AMBULATORY_CARE_PROVIDER_SITE_OTHER): Payer: Self-pay | Admitting: Surgery

## 2013-12-08 DIAGNOSIS — Z853 Personal history of malignant neoplasm of breast: Secondary | ICD-10-CM

## 2014-01-18 ENCOUNTER — Ambulatory Visit: Payer: Self-pay | Admitting: Gastroenterology

## 2014-05-17 ENCOUNTER — Ambulatory Visit: Payer: Self-pay | Admitting: Gastroenterology

## 2016-11-01 ENCOUNTER — Other Ambulatory Visit: Payer: Self-pay | Admitting: Unknown Physician Specialty

## 2016-11-01 DIAGNOSIS — G8929 Other chronic pain: Secondary | ICD-10-CM

## 2016-11-01 DIAGNOSIS — M5441 Lumbago with sciatica, right side: Principal | ICD-10-CM

## 2016-11-15 ENCOUNTER — Ambulatory Visit
Admission: RE | Admit: 2016-11-15 | Discharge: 2016-11-15 | Disposition: A | Discharge status: Home / Self Care | Payer: 59 | Source: Ambulatory Visit | Attending: Unknown Physician Specialty | Admitting: Unknown Physician Specialty

## 2016-11-15 ENCOUNTER — Ambulatory Visit: Payer: Self-pay

## 2016-11-15 DIAGNOSIS — G8929 Other chronic pain: Secondary | ICD-10-CM | POA: Diagnosis present

## 2016-11-15 DIAGNOSIS — M48061 Spinal stenosis, lumbar region without neurogenic claudication: Secondary | ICD-10-CM | POA: Diagnosis not present

## 2016-11-15 DIAGNOSIS — M5126 Other intervertebral disc displacement, lumbar region: Secondary | ICD-10-CM | POA: Diagnosis not present

## 2016-11-15 DIAGNOSIS — M5441 Lumbago with sciatica, right side: Secondary | ICD-10-CM | POA: Diagnosis present

## 2016-12-18 ENCOUNTER — Other Ambulatory Visit: Payer: Self-pay | Admitting: Surgery

## 2016-12-18 DIAGNOSIS — Z1231 Encounter for screening mammogram for malignant neoplasm of breast: Secondary | ICD-10-CM

## 2017-01-22 ENCOUNTER — Emergency Department: Payer: 59

## 2017-01-22 ENCOUNTER — Emergency Department
Admission: EM | Admit: 2017-01-22 | Discharge: 2017-01-22 | Disposition: A | Discharge status: Home / Self Care | Payer: 59 | Attending: Emergency Medicine | Admitting: Emergency Medicine

## 2017-01-22 DIAGNOSIS — Z79899 Other long term (current) drug therapy: Secondary | ICD-10-CM | POA: Insufficient documentation

## 2017-01-22 DIAGNOSIS — F17210 Nicotine dependence, cigarettes, uncomplicated: Secondary | ICD-10-CM | POA: Insufficient documentation

## 2017-01-22 DIAGNOSIS — R079 Chest pain, unspecified: Secondary | ICD-10-CM | POA: Diagnosis present

## 2017-01-22 DIAGNOSIS — F17200 Nicotine dependence, unspecified, uncomplicated: Secondary | ICD-10-CM

## 2017-01-22 DIAGNOSIS — I2699 Other pulmonary embolism without acute cor pulmonale: Secondary | ICD-10-CM | POA: Diagnosis not present

## 2017-01-22 LAB — CBC
HCT: 40.9 % (ref 35.0–47.0)
Hemoglobin: 14 g/dL (ref 12.0–16.0)
MCH: 31.8 pg (ref 26.0–34.0)
MCHC: 34.2 g/dL (ref 32.0–36.0)
MCV: 93 fL (ref 80.0–100.0)
PLATELETS: 190 10*3/uL (ref 150–440)
RBC: 4.4 MIL/uL (ref 3.80–5.20)
RDW: 13.4 % (ref 11.5–14.5)
WBC: 7.9 10*3/uL (ref 3.6–11.0)

## 2017-01-22 LAB — BASIC METABOLIC PANEL
Anion gap: 8 (ref 5–15)
BUN: 13 mg/dL (ref 6–20)
CO2: 25 mmol/L (ref 22–32)
CREATININE: 0.67 mg/dL (ref 0.44–1.00)
Calcium: 9.2 mg/dL (ref 8.9–10.3)
Chloride: 105 mmol/L (ref 101–111)
GFR calc non Af Amer: >60 mL/min (ref >60)
Glucose, Bld: 112 mg/dL — ABNORMAL HIGH (ref 65–99)
Potassium: 3.5 mmol/L (ref 3.5–5.1)
Sodium: 138 mmol/L (ref 135–145)

## 2017-01-22 LAB — TROPONIN I
Troponin I: <0.03 ng/mL (ref <0.03)
Troponin I: <0.03 ng/mL (ref <0.03)

## 2017-01-22 MED ORDER — ASPIRIN 81 MG PO CHEW
162.0000 mg | CHEWABLE_TABLET | Freq: Once | ORAL | Status: AC
  Administered 2017-01-22: 162 mg via ORAL
  Filled 2017-01-22: qty 2

## 2017-01-22 MED ORDER — ONDANSETRON HCL 4 MG/2ML IJ SOLN
INTRAMUSCULAR | Status: AC
  Filled 2017-01-22: qty 2

## 2017-01-22 NOTE — Discharge Instructions (Addendum)
Please make an appointment to follow-up with cardiology. Return to the emergency department for any concerns such as worsening chest pain shortness of breath or for any other issues.  It was a pleasure to take care of you today, and thank you for coming to our emergency department.  If you have any questions or concerns before leaving please ask the nurse to grab me and I'm more than happy to go through your aftercare instructions again.  If you were prescribed any opioid pain medication today such as Norco, Vicodin, Percocet, morphine, hydrocodone, or oxycodone please make sure you do not drive when you are taking this medication as it can alter your ability to drive safely.  If you have any concerns once you are home that you are not improving or are in fact getting worse before you can make it to your follow-up appointment, please do not hesitate to call 911 and come back for further evaluation.  Darel Hong MD  Results for orders placed or performed during the hospital encounter of 60/45/40  Basic metabolic panel  Result Value Ref Range   Sodium 138 135 - 145 mmol/L   Potassium 3.5 3.5 - 5.1 mmol/L   Chloride 105 101 - 111 mmol/L   CO2 25 22 - 32 mmol/L   Glucose, Bld 112 (H) 65 - 99 mg/dL   BUN 13 6 - 20 mg/dL   Creatinine, Ser 0.67 0.44 - 1.00 mg/dL   Calcium 9.2 8.9 - 10.3 mg/dL   GFR calc non Af Amer >60 >60 mL/min   GFR calc Af Amer >60 >60 mL/min   Anion gap 8 5 - 15  CBC  Result Value Ref Range   WBC 7.9 3.6 - 11.0 K/uL   RBC 4.40 3.80 - 5.20 MIL/uL   Hemoglobin 14.0 12.0 - 16.0 g/dL   HCT 40.9 35.0 - 47.0 %   MCV 93.0 80.0 - 100.0 fL   MCH 31.8 26.0 - 34.0 pg   MCHC 34.2 32.0 - 36.0 g/dL   RDW 13.4 11.5 - 14.5 %   Platelets 190 150 - 440 K/uL  Troponin I  Result Value Ref Range   Troponin I <0.03 <0.03 ng/mL  Troponin I  Result Value Ref Range   Troponin I <0.03 <0.03 ng/mL   Dg Chest 2 View  Result Date: 01/22/2017 CLINICAL DATA:  Patient reports  intermittent tightness and pain in chest x1 month that has gotten worse in last week. Reports occasional SOB. Current smoker. EXAM: CHEST  2 VIEW COMPARISON:  Chest x-ray dated 07/31/2012. FINDINGS: Heart size is normal. Overall cardiomediastinal silhouette is stable. Atherosclerotic changes noted at the aortic arch. Suspect chronic bronchitic changes centrally. Lungs otherwise clear. No pleural effusion or pneumothorax seen. Osseous structures about the chest are unremarkable. IMPRESSION: Probable chronic bronchitic changes centrally. No active cardiopulmonary disease. No evidence of pneumonia or pulmonary edema. Aortic atherosclerosis. Electronically Signed   By: Franki Cabot M.D.   On: 01/22/2017 14:51   US Venous Img Lower Unilateral Right  Result Date: 01/22/2017 CLINICAL DATA:  Leg edema and pain for 4 months with shortness of breath EXAM: Right LOWER EXTREMITY VENOUS DOPPLER ULTRASOUND TECHNIQUE: Gray-scale sonography with graded compression, as well as color Doppler and duplex ultrasound were performed to evaluate the lower extremity deep venous systems from the level of the common femoral vein and including the common femoral, femoral, profunda femoral, popliteal and calf veins including the posterior tibial, peroneal and gastrocnemius veins when visible. The superficial great saphenous  vein was also interrogated. Spectral Doppler was utilized to evaluate flow at rest and with distal augmentation maneuvers in the common femoral, femoral and popliteal veins. COMPARISON:  None. FINDINGS: Contralateral Common Femoral Vein: Respiratory phasicity is normal and symmetric with the symptomatic side. No evidence of thrombus. Normal compressibility. Common Femoral Vein: No evidence of thrombus. Normal compressibility, respiratory phasicity and response to augmentation. Saphenofemoral Junction: No evidence of thrombus. Normal compressibility and flow on color Doppler imaging. Profunda Femoral Vein: No evidence of  thrombus. Normal compressibility and flow on color Doppler imaging. Femoral Vein: No evidence of thrombus. Normal compressibility, respiratory phasicity and response to augmentation. Popliteal Vein: No evidence of thrombus. Normal compressibility, respiratory phasicity and response to augmentation. Calf Veins: No evidence of thrombus. Normal compressibility and flow on color Doppler imaging. Other Findings:  None. IMPRESSION: No evidence of DVT within the right lower extremity. Electronically Signed   By: Donavan Foil M.D.   On: 01/22/2017 18:15   PLEASE STOP SMOKING!

## 2017-01-22 NOTE — ED Provider Notes (Signed)
[facility] Emergency Department Provider Note  ____________________________________________   First MD Initiated Contact with Patient 01/22/17 1707     (approximate)  I have reviewed the triage vital signs and the nursing notes.   HISTORY  Chief Complaint Chest Pain and Leg Pain    HPI Courtney Villarreal is a 56 y.o. female who self presents to the emergency department withburning diffuse upper chest tightness for the past week or so. Pain is nonexertional. She's had no leg swelling. She's had no shortness of breath. She's never had a heart attack before. She has no history of COPD but she is a heavy smoker. She also reports moderate severity aching in the lateral aspect of her right leg. Her friend told her she might have a blood clot in her leg and she is concerned.   Past Medical History:  Diagnosis Date  . Degenerative disc disease    L4-5, uses aleve  . Degenerative disk disease   . GERD (gastroesophageal reflux disease)    takes otc zantac prn  . History of chest pain 2009   had stress test in [facility]-requested copy of records-no cardiac findings per pt  . PONV (postoperative nausea and vomiting)   . Shortness of breath    smoker    There are no active problems to display for this patient.   Past Surgical History:  Procedure Laterality Date  . ABDOMINAL HYSTERECTOMY  04/10/2011  . ANTERIOR AND POSTERIOR REPAIR  04/10/2011   Procedure: ANTERIOR (CYSTOCELE) AND POSTERIOR REPAIR (RECTOCELE);  Surgeon: Shon Millet II;  Location: [facility];  Service: Gynecology;  Laterality: N/A;  Sacrospinous Ligament Suspension  . BREAST SURGERY  05/07/12   Rt partial mastectomy  . conization of cervix     age 35  . LAPAROSCOPIC ASSISTED VAGINAL HYSTERECTOMY  04/10/2011   Procedure: LAPAROSCOPIC ASSISTED VAGINAL HYSTERECTOMY;  Surgeon: Shon Millet II;  Location: [facility];  Service: Gynecology;  Laterality: N/A;  . SALPINGOOPHORECTOMY  04/10/2011   Procedure:  SALPINGO OOPHERECTOMY;  Surgeon: Shon Millet II;  Location: [facility];  Service: Gynecology;  Laterality: Bilateral;    Prior to Admission medications   Medication Sig Start Date End Date Taking? Authorizing Provider  HYDROcodone-acetaminophen (NORCO/VICODIN) 5-325 MG per tablet Take 1 tablet by mouth every 6 (six) hours as needed for moderate pain. 09/15/13   Milton Ferguson, MD  Multiple Vitamins-Minerals (MULTIVITAMIN WITH MINERALS) tablet Take 1 tablet by mouth daily.    [provider]  ondansetron (ZOFRAN ODT) 4 MG disintegrating tablet 4mg  ODT q4 hours prn nausea/vomit 09/15/13   Milton Ferguson, MD  oxyCODONE-acetaminophen (PERCOCET/ROXICET) 5-325 MG per tablet Take 1 tablet by mouth every 4 (four) hours as needed for severe pain. 09/15/13   Ripley Fraise, MD  pantoprazole (PROTONIX) 20 MG tablet Take 1 tablet (20 mg total) by mouth daily. 09/15/13   Milton Ferguson, MD    Allergies Patient has no known allergies.  No family history on file.  Social History Social History  Substance Use Topics  . Smoking status: Current Every Day Smoker    Packs/day: 1.00    Years: 31.00    Types: Cigarettes  . Smokeless tobacco: Never Used  . Alcohol use No    Review of Systems Constitutional: No fever/chills Eyes: No visual changes. ENT: No sore throat. Cardiovascular: Positive chest pain. Respiratory: Denies shortness of breath. Gastrointestinal: No abdominal pain.  No nausea, no vomiting.  No diarrhea.  No constipation. Genitourinary: Negative for dysuria. Musculoskeletal:  Negative for back pain. Skin: Negative for rash. Neurological: Negative for headaches, focal weakness or numbness.   ____________________________________________   PHYSICAL EXAM:  VITAL SIGNS: ED Triage Vitals  Enc Vitals Group     BP 01/22/17 1411 126/77     Pulse Rate 01/22/17 1411 (!) 59     Resp 01/22/17 1411 16     Temp 01/22/17 1411 97.9 F (36.6 C)     Temp Source 01/22/17 1411 Oral      SpO2 01/22/17 1411 98 %     Weight 01/22/17 1410 170 lb (77.1 kg)     Height 01/22/17 1410 5\' 4"  (1.626 m)     Head Circumference --      Peak Flow --      Pain Score 01/22/17 1408 8     Pain Loc --      Pain Edu? --      Excl. in [place]? --     Constitutional: Alert and oriented 4 well appearing Eyes: PERRL EOMI. Head: Atraumatic. Nose: No congestion/rhinnorhea. Mouth/Throat: No trismus Neck: No stridor.   Cardiovascular: Normal rate, regular rhythm. Grossly normal heart sounds.  Good peripheral circulation. Respiratory: Normal respiratory effort.  No retractions. Lungs CTAB and moving good air Gastrointestinal: Soft nontender Musculoskeletal: No lower extremity edema   Neurologic:  Normal speech and language. No gross focal neurologic deficits are appreciated. Skin:  Skin is warm, dry and intact. No rash noted. Psychiatric: Mood and affect are normal. Speech and behavior are normal.    ____________________________________________   DIFFERENTIAL includes but not limited to  Acute coronary syndrome, COPD exacerbation, pneumothorax, Boerhaave syndrome, details of ____________________________________________   LABS (all labs ordered are listed, but only abnormal results are displayed)  Labs Reviewed  BASIC METABOLIC PANEL - Abnormal; Notable for the following:       Result Value   Glucose, Bld 112 (*)    All other components within normal limits  CBC  TROPONIN I  TROPONIN I    No signs of acute ischemia 2 __________________________________________  EKG  ED ECG REPORT I, Darel Hong, the attending physician, personally viewed and interpreted this ECG.  Date: 01/22/2017 Rate: 58 Rhythm: Sinus bradycardia QRS Axis: normal Intervals: normal ST/T Wave abnormalities: normal Narrative Interpretation: Borderline  ____________________________________________  RADIOLOGY   ____________________________________________   PROCEDURES  Procedure(s) performed:  no  Procedures  Critical Care performed: no  Observation: no ____________________________________________   INITIAL IMPRESSION / ASSESSMENT AND PLAN / ED COURSE  Pertinent labs & imaging results that were available during my care of the patient were reviewed by me and considered in my medical decision making (see chart for details).  The patient arrives is somewhat atypical chest pain although she is middle-aged a heavy smoker. Fortunately her 2 troponins are negative. Her ultrasound for right lower extremity is also negative. She understands that we'll we have ruled her out for acute myocardial ischemia we have not ruled out stable angina. She needs to be followed up by cardiology.    ----------------------------------------- 7:18 PM on 01/22/2017 -----------------------------------------  The patient understands that I would like her to follow up with cardiology for reevaluation. The on-call cardiologist today is Dr. Oval Linsey, however the patient says that she would like to follow up with Dr. Nehemiah Massed as he is her husband's cardiologist. Regardless she is medically stable for outpatient management.  ____________________________________________   FINAL CLINICAL IMPRESSION(S) / ED DIAGNOSES  Final diagnoses:  Other acute pulmonary embolism without acute cor pulmonale ([place])  Chest pain,  unspecified type  Smoking      NEW MEDICATIONS STARTED DURING THIS VISIT:  Discharge Medication List as of 01/22/2017  7:19 PM       Note:  This document was prepared using Dragon voice recognition software and may include unintentional dictation errors.     Darel Hong, MD 01/22/17 [phone]

## 2017-01-22 NOTE — ED Notes (Signed)
Pt in US

## 2017-01-22 NOTE — ED Triage Notes (Signed)
Pt c/o burning, tightness in the chest for the past week, pt also c/o increased pain in the right leg for the past year and had a pain study done.

## 2017-04-18 ENCOUNTER — Other Ambulatory Visit: Payer: Self-pay | Admitting: Physician Assistant

## 2017-04-18 DIAGNOSIS — Z1231 Encounter for screening mammogram for malignant neoplasm of breast: Secondary | ICD-10-CM

## 2017-05-14 ENCOUNTER — Ambulatory Visit
Admission: RE | Admit: 2017-05-14 | Discharge: 2017-05-14 | Disposition: A | Discharge status: Home / Self Care | Payer: 59 | Source: Ambulatory Visit | Attending: Physician Assistant | Admitting: Physician Assistant

## 2017-05-14 DIAGNOSIS — Z1231 Encounter for screening mammogram for malignant neoplasm of breast: Secondary | ICD-10-CM | POA: Diagnosis not present

## 2018-10-13 ENCOUNTER — Other Ambulatory Visit: Payer: Self-pay | Admitting: Physician Assistant

## 2018-10-13 DIAGNOSIS — Z1231 Encounter for screening mammogram for malignant neoplasm of breast: Secondary | ICD-10-CM

## 2018-10-14 ENCOUNTER — Telehealth: Payer: Self-pay | Admitting: *Deleted

## 2018-10-14 NOTE — Telephone Encounter (Signed)
Received referral for low dose lung cancer screening CT scan. Message left at phone number listed in EMR for patient to call me back to facilitate scheduling scan.  

## 2018-10-15 ENCOUNTER — Telehealth: Payer: Self-pay | Admitting: *Deleted

## 2018-10-15 NOTE — Telephone Encounter (Signed)
Received referral for low dose lung cancer screening CT scan. Message left at phone number listed in EMR for patient to call me back to facilitate scheduling scan.  

## 2018-12-10 ENCOUNTER — Telehealth: Payer: Self-pay | Admitting: *Deleted

## 2018-12-10 NOTE — Telephone Encounter (Signed)
Received referral for low dose lung cancer screening CT scan. Message left at phone number listed in EMR for patient to call me back to facilitate scheduling scan.  

## 2018-12-16 ENCOUNTER — Telehealth: Payer: Self-pay | Admitting: *Deleted

## 2018-12-16 NOTE — Telephone Encounter (Signed)
Received referral for low dose lung cancer screening CT scan. Message left at phone number listed in EMR for patient to call me back to facilitate scheduling scan.  

## 2018-12-26 ENCOUNTER — Telehealth: Payer: Self-pay | Admitting: *Deleted

## 2018-12-26 DIAGNOSIS — Z122 Encounter for screening for malignant neoplasm of respiratory organs: Secondary | ICD-10-CM

## 2018-12-26 DIAGNOSIS — Z87891 Personal history of nicotine dependence: Secondary | ICD-10-CM

## 2018-12-26 NOTE — Telephone Encounter (Signed)
Received referral for initial lung cancer screening scan. Contacted patient and obtained smoking history,(current, 31 pack year) as well as answering questions related to screening process. Patient denies signs of lung cancer such as weight loss or hemoptysis. Patient denies comorbidity that would prevent curative treatment if lung cancer were found. Patient is scheduled for shared decision making visit and CT scan on (date to be determined due to patient's schedule limitations).

## 2019-01-02 ENCOUNTER — Inpatient Hospital Stay: Payer: BC Managed Care – PPO | Attending: Nurse Practitioner | Admitting: Hospice and Palliative Medicine

## 2019-01-02 ENCOUNTER — Ambulatory Visit
Admission: RE | Admit: 2019-01-02 | Discharge: 2019-01-02 | Disposition: A | Discharge status: Home / Self Care | Payer: BC Managed Care – PPO | Source: Ambulatory Visit | Attending: Oncology | Admitting: Oncology

## 2019-01-02 ENCOUNTER — Other Ambulatory Visit: Payer: Self-pay

## 2019-01-02 DIAGNOSIS — Z122 Encounter for screening for malignant neoplasm of respiratory organs: Secondary | ICD-10-CM | POA: Insufficient documentation

## 2019-01-02 DIAGNOSIS — Z87891 Personal history of nicotine dependence: Secondary | ICD-10-CM | POA: Insufficient documentation

## 2019-01-02 NOTE — Progress Notes (Signed)
In accordance with CMS guidelines, patient has met eligibility criteria including age, absence of signs or symptoms of lung cancer.  Social History   Tobacco Use  . Smoking status: Current Every Day Smoker    Packs/day: 1.00    Years: 31.00    Pack years: 31.00    Types: Cigarettes  . Smokeless tobacco: Never Used  Substance Use Topics  . Alcohol use: No  . Drug use: No      A shared decision-making session was conducted prior to the performance of CT scan. This includes one or more decision aids, includes benefits and harms of screening, follow-up diagnostic testing, over-diagnosis, false positive rate, and total radiation exposure.   Counseling on the importance of adherence to annual lung cancer LDCT screening, impact of co-morbidities, and ability or willingness to undergo diagnosis and treatment is imperative for compliance of the program.   Counseling on the importance of continued smoking cessation for former smokers; the importance of smoking cessation for current smokers, and information about tobacco cessation interventions have been given to patient including [place] and [facility] quit [place] programs.   Written order for lung cancer screening with LDCT has been given to the patient and any and all questions have been answered to the best of my abilities.    Yearly follow up will be coordinated by Burgess Estelle, Thoracic Navigator.  Time Total: 15 minutes  Visit consisted of counseling and education dealing with complex health screening. Greater than 50%  of this time was spent counseling and coordinating care related to the above assessment and plan.  Signed by: Altha Harm, PhD, NP-C [phone] (Work Cell)

## 2019-01-05 ENCOUNTER — Encounter: Payer: Self-pay | Admitting: *Deleted

## 2019-04-21 ENCOUNTER — Telehealth: Payer: Self-pay | Admitting: Gastroenterology

## 2019-04-21 NOTE — Telephone Encounter (Signed)
Pt  Left vm to schedule a colonoscopy with  Dr. Allen Norris

## 2019-04-22 ENCOUNTER — Encounter: Payer: Self-pay | Admitting: Gastroenterology

## 2019-04-22 NOTE — Telephone Encounter (Signed)
Left vm to inform pt we need a Referral from PCP per Michelle's note

## 2019-04-23 ENCOUNTER — Telehealth: Payer: Self-pay | Admitting: Gastroenterology

## 2019-04-23 NOTE — Telephone Encounter (Signed)
Pt has referral to schedule procedure with Dr. Allen Norris

## 2019-04-23 NOTE — Telephone Encounter (Signed)
LVM for pt returning her call to schedule colonoscopy.  Asked her to call office back to schedule.  Thanks [employer]

## 2019-04-27 ENCOUNTER — Telehealth: Payer: Self-pay | Admitting: Gastroenterology

## 2019-04-27 NOTE — Telephone Encounter (Signed)
Pt left vm for Michelle to schedule a procedure

## 2019-04-28 ENCOUNTER — Telehealth: Payer: Self-pay | Admitting: Gastroenterology

## 2019-04-28 NOTE — Telephone Encounter (Signed)
Pt left vm for Sharyn Lull she states she is still trying to get in contact with her please call pt she states she does get of at 3:30 today

## 2019-04-28 NOTE — Telephone Encounter (Signed)
Patient called & l/m to schedule a colonoscopy.

## 2019-04-28 NOTE — Telephone Encounter (Signed)
Returned patients call to schedule colonoscopy.  Asked her to call the office back to schedule-we do have Friday's available as she would like to have. Either location is available on Fridays.  Will await call back to schedule.  Thanks [employer]

## 2019-04-28 NOTE — Telephone Encounter (Signed)
Returned patients call.  LVM for her to call the office back on my direct number provided to schedule her colonoscopy.  Thanks [employer]

## 2019-04-29 ENCOUNTER — Other Ambulatory Visit: Payer: Self-pay

## 2019-04-29 ENCOUNTER — Telehealth: Payer: Self-pay

## 2019-04-29 DIAGNOSIS — Z1211 Encounter for screening for malignant neoplasm of colon: Secondary | ICD-10-CM

## 2019-04-29 DIAGNOSIS — Z86010 Personal history of colonic polyps: Secondary | ICD-10-CM

## 2019-04-29 NOTE — Telephone Encounter (Signed)
Gastroenterology Pre-Procedure Review  Request Date: Friday 05/15/19 Requesting Physician: Dr. Allen Norris  PATIENT REVIEW QUESTIONS: The patient responded to the following health history questions as indicated:    1. Are you having any GI issues? no 2. Do you have a personal history of Polyps? yes (2015 with Dr. Allen Norris) 3. Do you have a family history of Colon Cancer or Polyps? no 4. Diabetes Mellitus? no 5. Joint replacements in the past 12 months?no 6. Major health problems in the past 3 months?no 7. Any artificial heart valves, MVP, or defibrillator?no    MEDICATIONS & ALLERGIES:    Patient reports the following regarding taking any anticoagulation/antiplatelet therapy:   Plavix, Coumadin, Eliquis, Xarelto, Lovenox, Pradaxa, Brilinta, or Effient? no Aspirin? no  Pulmonary Conditions?  Pt states early COPD BMI? Not noted. Wt 155  Patient confirms/reports the following medications:  Current Outpatient Medications  Medication Sig Dispense Refill  . HYDROcodone-acetaminophen (NORCO/VICODIN) 5-325 MG per tablet Take 1 tablet by mouth every 6 (six) hours as needed for moderate pain. 20 tablet 0  . Multiple Vitamins-Minerals (MULTIVITAMIN WITH MINERALS) tablet Take 1 tablet by mouth daily.    . ondansetron (ZOFRAN ODT) 4 MG disintegrating tablet 4mg  ODT q4 hours prn nausea/vomit 10 tablet 0  . oxyCODONE-acetaminophen (PERCOCET/ROXICET) 5-325 MG per tablet Take 1 tablet by mouth every 4 (four) hours as needed for severe pain. 15 tablet 0  . pantoprazole (PROTONIX) 20 MG tablet Take 1 tablet (20 mg total) by mouth daily. 20 tablet 0   No current facility-administered medications for this visit.     Patient confirms/reports the following allergies:  No Known Allergies  No orders of the defined types were placed in this encounter.   AUTHORIZATION INFORMATION Primary Insurance: 1D#: Group #:  Secondary Insurance: 1D#: Group #:  SCHEDULE INFORMATION: Date:  Time: Location:

## 2019-05-11 ENCOUNTER — Encounter: Payer: Self-pay | Admitting: *Deleted

## 2019-05-11 ENCOUNTER — Other Ambulatory Visit: Payer: Self-pay

## 2019-05-12 ENCOUNTER — Other Ambulatory Visit
Admission: RE | Admit: 2019-05-12 | Discharge: 2019-05-12 | Disposition: A | Discharge status: Home / Self Care | Payer: BC Managed Care – PPO | Source: Ambulatory Visit | Attending: Gastroenterology | Admitting: Gastroenterology

## 2019-05-12 ENCOUNTER — Telehealth: Payer: Self-pay | Admitting: Gastroenterology

## 2019-05-12 DIAGNOSIS — Z01812 Encounter for preprocedural laboratory examination: Secondary | ICD-10-CM | POA: Insufficient documentation

## 2019-05-12 DIAGNOSIS — Z20828 Contact with and (suspected) exposure to other viral communicable diseases: Secondary | ICD-10-CM | POA: Diagnosis not present

## 2019-05-12 LAB — SARS CORONAVIRUS 2 (TAT 6-24 HRS): SARS Coronavirus 2: NEGATIVE

## 2019-05-12 NOTE — Telephone Encounter (Signed)
Pt is calling she needs her rx and instructions please call pt she is on the way to Covid testing now for her procedure on Friday  cb [phone]

## 2019-05-12 NOTE — Telephone Encounter (Signed)
Patient has been asked to stop by the office after she has her COVID Test to pick up instructions.  Provided her with a WuPrep Bowel Kit since she did not get her instructions in mail.  Thanks  [employer]

## 2019-05-13 NOTE — Discharge Instructions (Signed)

## 2019-05-15 ENCOUNTER — Ambulatory Visit
Admission: RE | Admit: 2019-05-15 | Discharge: 2019-05-15 | Disposition: A | Discharge status: Home / Self Care | Payer: BC Managed Care – PPO | Attending: Gastroenterology | Admitting: Gastroenterology

## 2019-05-15 ENCOUNTER — Ambulatory Visit: Payer: BC Managed Care – PPO | Admitting: Anesthesiology

## 2019-05-15 ENCOUNTER — Encounter
Admission: RE | Disposition: A | Discharge status: Home / Self Care | Payer: Self-pay | Source: Home / Self Care | Attending: Gastroenterology

## 2019-05-15 ENCOUNTER — Other Ambulatory Visit: Payer: Self-pay

## 2019-05-15 ENCOUNTER — Encounter: Payer: Self-pay | Admitting: Gastroenterology

## 2019-05-15 DIAGNOSIS — J449 Chronic obstructive pulmonary disease, unspecified: Secondary | ICD-10-CM | POA: Insufficient documentation

## 2019-05-15 DIAGNOSIS — Z803 Family history of malignant neoplasm of breast: Secondary | ICD-10-CM | POA: Insufficient documentation

## 2019-05-15 DIAGNOSIS — K635 Polyp of colon: Secondary | ICD-10-CM | POA: Insufficient documentation

## 2019-05-15 DIAGNOSIS — Z90710 Acquired absence of both cervix and uterus: Secondary | ICD-10-CM | POA: Diagnosis not present

## 2019-05-15 DIAGNOSIS — D120 Benign neoplasm of cecum: Secondary | ICD-10-CM | POA: Insufficient documentation

## 2019-05-15 DIAGNOSIS — Z860100 Personal history of colon polyps, unspecified: Secondary | ICD-10-CM

## 2019-05-15 DIAGNOSIS — F17210 Nicotine dependence, cigarettes, uncomplicated: Secondary | ICD-10-CM | POA: Diagnosis not present

## 2019-05-15 DIAGNOSIS — M5136 Other intervertebral disc degeneration, lumbar region: Secondary | ICD-10-CM | POA: Diagnosis not present

## 2019-05-15 DIAGNOSIS — Z90721 Acquired absence of ovaries, unilateral: Secondary | ICD-10-CM | POA: Diagnosis not present

## 2019-05-15 DIAGNOSIS — Z7951 Long term (current) use of inhaled steroids: Secondary | ICD-10-CM | POA: Insufficient documentation

## 2019-05-15 DIAGNOSIS — Z1211 Encounter for screening for malignant neoplasm of colon: Secondary | ICD-10-CM | POA: Insufficient documentation

## 2019-05-15 DIAGNOSIS — K5730 Diverticulosis of large intestine without perforation or abscess without bleeding: Secondary | ICD-10-CM | POA: Insufficient documentation

## 2019-05-15 DIAGNOSIS — Z79899 Other long term (current) drug therapy: Secondary | ICD-10-CM | POA: Diagnosis not present

## 2019-05-15 DIAGNOSIS — K219 Gastro-esophageal reflux disease without esophagitis: Secondary | ICD-10-CM | POA: Diagnosis not present

## 2019-05-15 DIAGNOSIS — K621 Rectal polyp: Secondary | ICD-10-CM | POA: Insufficient documentation

## 2019-05-15 HISTORY — PX: COLONOSCOPY WITH PROPOFOL: SHX5780

## 2019-05-15 HISTORY — PX: POLYPECTOMY: SHX5525

## 2019-05-15 SURGERY — COLONOSCOPY WITH PROPOFOL
Anesthesia: General | Site: Rectum

## 2019-05-15 MED ORDER — STERILE WATER FOR IRRIGATION IR SOLN
Status: DC | PRN
  Administered 2019-05-15: .05 mL

## 2019-05-15 MED ORDER — LIDOCAINE HCL (CARDIAC) PF 100 MG/5ML IV SOSY
PREFILLED_SYRINGE | INTRAVENOUS | Status: DC | PRN
  Administered 2019-05-15: 40 mg via INTRAVENOUS

## 2019-05-15 MED ORDER — PROPOFOL 10 MG/ML IV BOLUS
INTRAVENOUS | Status: DC | PRN
  Administered 2019-05-15: 30 mg via INTRAVENOUS
  Administered 2019-05-15: 20 mg via INTRAVENOUS
  Administered 2019-05-15: 70 mg via INTRAVENOUS
  Administered 2019-05-15 (×5): 20 mg via INTRAVENOUS

## 2019-05-15 MED ORDER — LACTATED RINGERS IV SOLN
INTRAVENOUS | Status: DC
  Administered 2019-05-15: 08:00:00 via INTRAVENOUS

## 2019-05-15 SURGICAL SUPPLY — 6 items
CANISTER SUCT 1200ML W/VALVE (MISCELLANEOUS) ×4 IMPLANT
FORCEPS BIOP RAD 4 LRG CAP 4 (CUTTING FORCEPS) ×2 IMPLANT
GOWN CVR UNV OPN BCK APRN NK (MISCELLANEOUS) ×4 IMPLANT
GOWN ISOL THUMB LOOP REG UNIV (MISCELLANEOUS) ×8
KIT ENDO PROCEDURE OLY (KITS) ×4 IMPLANT
WATER STERILE IRR 250ML POUR (IV SOLUTION) ×4 IMPLANT

## 2019-05-15 NOTE — Anesthesia Procedure Notes (Signed)
Performed by: Porfiria Heinrich, CRNA Pre-anesthesia Checklist: Patient identified, Emergency Drugs available, Suction available, Timeout performed and Patient being monitored Patient Re-evaluated:Patient Re-evaluated prior to induction Oxygen Delivery Method: Nasal cannula Placement Confirmation: positive ETCO2       

## 2019-05-15 NOTE — Op Note (Addendum)
[facility] Gastroenterology Patient Name: Courtney Villarreal Procedure Date: 05/15/2019 8:45 AM MRN: [id number] Account #: [id number] Date of Birth: 1960-11-21 Admit Type: Outpatient Age: 58 Room: [facility] OR ROOM 01 Gender: Female Note Status: Finalized Procedure:            Colonoscopy Indications:          High risk colon cancer surveillance: Personal history                        of colonic polyps Providers:            Lucilla Lame MD, MD Referring MD:         Precious Bard, MD (Referring MD) Medicines:            Propofol per Anesthesia Complications:        No immediate complications. Procedure:            Pre-Anesthesia Assessment:                       - Prior to the procedure, a History and Physical was                        performed, and patient medications and allergies were                        reviewed. The patient's tolerance of previous                        anesthesia was also reviewed. The risks and benefits of                        the procedure and the sedation options and risks were                        discussed with the patient. All questions were                        answered, and informed consent was obtained. Prior                        Anticoagulants: The patient has taken no previous                        anticoagulant or antiplatelet agents. ASA Grade                        Assessment: II - A patient with mild systemic disease.                        After reviewing the risks and benefits, the patient was                        deemed in satisfactory condition to undergo the                        procedure.                       After obtaining informed consent, the colonoscope was  passed under direct vision. Throughout the procedure,                        the patient's blood pressure, pulse, and oxygen                        saturations were monitored continuously. The                         Colonoscope was introduced through the anus and                        advanced to the the cecum, identified by appendiceal                        orifice and ileocecal valve. The colonoscopy was                        performed without difficulty. The patient tolerated the                        procedure well. The quality of the bowel preparation                        was excellent. Findings:      The perianal and digital rectal examinations were normal.      A 2 mm polyp was found in the cecum. The polyp was sessile. The polyp       was removed with a cold biopsy forceps. Resection and retrieval were       complete.      A 4 mm polyp was found in the sigmoid colon. The polyp was sessile. The       polyp was removed with a cold biopsy forceps. Resection and retrieval       were complete.      A 4 mm polyp was found in the rectum. The polyp was sessile. The polyp       was removed with a cold biopsy forceps. Resection and retrieval were       complete.      Multiple small-mouthed diverticula were found in the entire colon. Impression:           - One 2 mm polyp in the cecum, removed with a cold                        biopsy forceps. Resected and retrieved.                       - One 4 mm polyp in the sigmoid colon, removed with a                        cold biopsy forceps. Resected and retrieved.                       - One 4 mm polyp in the rectum, removed with a cold                        biopsy forceps. Resected and retrieved.                       -  Diverticulosis in the entire examined colon. Recommendation:       - Discharge patient to home.                       - Resume previous diet.                       - Continue present medications.                       - Await pathology results.                       - Repeat colonoscopy in 5 years for surveillance. Procedure Code(s):    --- Professional ---                       [phone], Colonoscopy, flexible; with biopsy, single or                         multiple Diagnosis Code(s):    --- Professional ---                       Z86.010, Personal history of colonic polyps                       K63.5, Polyp of colon                       K62.1, Rectal polyp CPT copyright 2019 American Medical Association. All rights reserved. The codes documented in this report are preliminary and upon coder review may  be revised to meet current compliance requirements. Lucilla Lame MD, MD 05/15/2019 9:14:10 AM This report has been signed electronically. Number of Addenda: 0 Note Initiated On: 05/15/2019 8:45 AM Scope Withdrawal Time: 0 hours 8 minutes 27 seconds  Total Procedure Duration: 0 hours 12 minutes 2 seconds  Estimated Blood Loss: Estimated blood loss: none.      [facility]

## 2019-05-15 NOTE — Anesthesia Preprocedure Evaluation (Signed)
Anesthesia Evaluation  Patient identified by MRN, date of birth, ID band Patient awake    Airway Mallampati: II  TM Distance: >3 FB Neck ROM: Full    Dental  (+)    Pulmonary COPD,  COPD inhaler, Current Smoker (1 ppd)Patient did not abstain from smoking.,    Pulmonary exam normal        Cardiovascular Exercise Tolerance: Good negative cardio ROS Normal cardiovascular exam     Neuro/Psych negative neurological ROS     GI/Hepatic negative GI ROS, Neg liver ROS,   Endo/Other  negative endocrine ROS  Renal/GU negative Renal ROS     Musculoskeletal negative musculoskeletal ROS (+)   Abdominal   Peds  Hematology negative hematology ROS (+)   Anesthesia Other Findings   Reproductive/Obstetrics                             Anesthesia Physical Anesthesia Plan  ASA: II  Anesthesia Plan: General   Post-op Pain Management:    Induction:   PONV Risk Score and Plan: 2 and Propofol infusion and TIVA  Airway Management Planned: Natural Airway and Nasal Cannula  Additional Equipment:   Intra-op Plan:   Post-operative Plan:   Informed Consent: I have reviewed the patients History and Physical, chart, labs and discussed the procedure including the risks, benefits and alternatives for the proposed anesthesia with the patient or authorized representative who has indicated his/her understanding and acceptance.       Plan Discussed with:   Anesthesia Plan Comments:         Anesthesia Quick Evaluation

## 2019-05-15 NOTE — H&P (Signed)
Lucilla Lame, MD [address] [place], [place] [address] Phone:[phone] Fax : [phone]  Primary Care Physician:  [facility] Primary Gastroenterologist:  Dr. Allen Norris  Pre-Procedure History & Physical: HPI:  Courtney Villarreal is a 58 y.o. female is here for an colonoscopy.   Past Medical History:  Diagnosis Date  . Degenerative disc disease    L4-5, uses aleve  . Degenerative disk disease   . GERD (gastroesophageal reflux disease)    takes otc zantac prn  . History of chest pain 2009   had stress test in [facility]-requested copy of records-no cardiac findings per pt  . PONV (postoperative nausea and vomiting)   . Shortness of breath    smoker    Past Surgical History:  Procedure Laterality Date  . ABDOMINAL HYSTERECTOMY  04/10/2011  . ANTERIOR AND POSTERIOR REPAIR  04/10/2011   Procedure: ANTERIOR (CYSTOCELE) AND POSTERIOR REPAIR (RECTOCELE);  Surgeon: Shon Millet II;  Location: [facility];  Service: Gynecology;  Laterality: N/A;  Sacrospinous Ligament Suspension  . BREAST EXCISIONAL BIOPSY Right 2013   ADH  . BREAST SURGERY  05/07/12   Rt partial mastectomy  . conization of cervix     age 77  . LAPAROSCOPIC ASSISTED VAGINAL HYSTERECTOMY  04/10/2011   Procedure: LAPAROSCOPIC ASSISTED VAGINAL HYSTERECTOMY;  Surgeon: Shon Millet II;  Location: [facility];  Service: Gynecology;  Laterality: N/A;  . SALPINGOOPHORECTOMY  04/10/2011   Procedure: SALPINGO OOPHERECTOMY;  Surgeon: Shon Millet II;  Location: [facility];  Service: Gynecology;  Laterality: Bilateral;    Prior to Admission medications   Medication Sig Start Date End Date Taking? Authorizing Provider  albuterol (VENTOLIN HFA) 108 (90 Base) MCG/ACT inhaler Inhale into the lungs every 6 (six) hours as needed for wheezing or shortness of breath.   Yes [provider]  pantoprazole (PROTONIX) 20 MG tablet Take 1 tablet (20 mg total) by mouth daily. Patient not taking: Reported on 05/11/2019  09/15/13   Milton Ferguson, MD    Allergies as of 04/29/2019  . (No Known Allergies)    Family History  Problem Relation Age of Onset  . Breast cancer Paternal Aunt 21    Social History   Socioeconomic History  . Marital status: Single    Spouse name: Not on file  . Number of children: Not on file  . Years of education: Not on file  . Highest education level: Not on file  Occupational History  . Not on file  Social Needs  . Financial resource strain: Not on file  . Food insecurity    Worry: Not on file    Inability: Not on file  . Transportation needs    Medical: Not on file    Non-medical: Not on file  Tobacco Use  . Smoking status: Current Every Day Smoker    Packs/day: 1.00    Years: 41.00    Pack years: 41.00    Types: Cigarettes  . Smokeless tobacco: Never Used  . Tobacco comment: since age 80 (05/11/19 - currently 2-3 cigs/day)  Substance and Sexual Activity  . Alcohol use: No  . Drug use: No  . Sexual activity: Not Currently  Lifestyle  . Physical activity    Days per week: Not on file    Minutes per session: Not on file  . Stress: Not on file  Relationships  . Social Herbalist on phone: Not on file    Gets together: Not on  file    Attends religious service: Not on file    Active member of club or organization: Not on file    Attends meetings of clubs or organizations: Not on file    Relationship status: Not on file  . Intimate partner violence    Fear of current or ex partner: Not on file    Emotionally abused: Not on file    Physically abused: Not on file    Forced sexual activity: Not on file  Other Topics Concern  . Not on file  Social History Narrative  . Not on file    Review of Systems: See HPI, otherwise negative ROS  Physical Exam: Ht 5\' 4"  (1.626 m)   Wt 68 kg   BMI 25.75 kg/m  General:   Alert,  pleasant and cooperative in NAD Head:  Normocephalic and atraumatic. Neck:  Supple; no masses or thyromegaly. Lungs:   Clear throughout to auscultation.    Heart:  Regular rate and rhythm. Abdomen:  Soft, nontender and nondistended. Normal bowel sounds, without guarding, and without rebound.   Neurologic:  Alert and  oriented x4;  grossly normal neurologically.  Impression/Plan: Courtney Villarreal is here for an colonoscopy to be performed for adenomatous polyp on 05/17/2014  Risks, benefits, limitations, and alternatives regarding  colonoscopy have been reviewed with the patient.  Questions have been answered.  All parties agreeable.   Lucilla Lame, MD  05/15/2019, 7:54 AM

## 2019-05-15 NOTE — Transfer of Care (Signed)
Immediate Anesthesia Transfer of Care Note  Patient: Courtney Villarreal  Procedure(s) Performed: COLONOSCOPY WITH PROPOFOL (N/A Rectum) POLYPECTOMY (Rectum)  Patient Location: PACU  Anesthesia Type: General  Level of Consciousness: awake, alert  and patient cooperative  Airway and Oxygen Therapy: Patient Spontanous Breathing and Patient connected to supplemental oxygen  Post-op Assessment: Post-op Vital signs reviewed, Patient's Cardiovascular Status Stable, Respiratory Function Stable, Patent Airway and No signs of Nausea or vomiting  Post-op Vital Signs: Reviewed and stable  Complications: No apparent anesthesia complications

## 2019-05-15 NOTE — Anesthesia Postprocedure Evaluation (Signed)
Anesthesia Post Note  Patient: Courtney Villarreal  Procedure(s) Performed: COLONOSCOPY WITH PROPOFOL (N/A Rectum) POLYPECTOMY (Rectum)  Patient location during evaluation: PACU Anesthesia Type: General Level of consciousness: awake and alert Pain management: pain level controlled Vital Signs Assessment: post-procedure vital signs reviewed and stable Respiratory status: spontaneous breathing, nonlabored ventilation, respiratory function stable and patient connected to nasal cannula oxygen Cardiovascular status: blood pressure returned to baseline and stable Postop Assessment: no apparent nausea or vomiting Anesthetic complications: no    Adele Barthel Misha Vanoverbeke

## 2019-05-18 ENCOUNTER — Encounter: Payer: Self-pay | Admitting: Gastroenterology

## 2019-09-18 ENCOUNTER — Other Ambulatory Visit: Payer: Self-pay | Admitting: Physician Assistant

## 2019-09-18 DIAGNOSIS — Z1231 Encounter for screening mammogram for malignant neoplasm of breast: Secondary | ICD-10-CM

## 2019-09-20 ENCOUNTER — Ambulatory Visit: Payer: BC Managed Care – PPO | Attending: Internal Medicine

## 2019-09-20 DIAGNOSIS — Z23 Encounter for immunization: Secondary | ICD-10-CM | POA: Insufficient documentation

## 2019-09-20 NOTE — Progress Notes (Signed)
   Covid-19 Vaccination Clinic  Name:  Courtney Villarreal    MRN: [id number] DOB: Nov 30, 1960  09/20/2019  [name] was observed post Covid-19 immunization for 15 minutes without incident. She was provided with Vaccine Information Sheet and instruction to access the V-Safe system.   [name] was instructed to call 911 with any severe reactions post vaccine: Marland Kitchen Difficulty breathing  . Swelling of face and throat  . A fast heartbeat  . A bad rash all over body  . Dizziness and weakness   Immunizations Administered    Name Date Dose VIS Date Route   Pfizer COVID-19 Vaccine 09/20/2019  3:58 PM 0.3 mL 06/26/2019 Intramuscular   Manufacturer: [place]   Lot: [id number]   [place]: [id number]

## 2019-10-02 ENCOUNTER — Ambulatory Visit
Admission: RE | Admit: 2019-10-02 | Discharge: 2019-10-02 | Disposition: A | Discharge status: Home / Self Care | Payer: BC Managed Care – PPO | Source: Ambulatory Visit | Attending: Physician Assistant | Admitting: Physician Assistant

## 2019-10-02 DIAGNOSIS — Z1231 Encounter for screening mammogram for malignant neoplasm of breast: Secondary | ICD-10-CM | POA: Insufficient documentation

## 2019-10-11 ENCOUNTER — Ambulatory Visit: Payer: BC Managed Care – PPO | Attending: Internal Medicine

## 2019-10-11 DIAGNOSIS — Z23 Encounter for immunization: Secondary | ICD-10-CM

## 2019-10-11 NOTE — Progress Notes (Signed)
   Covid-19 Vaccination Clinic  Name:  Courtney Villarreal    MRN: [id number] DOB: 1961-06-26  10/11/2019  [name] was observed post Covid-19 immunization for 15 minutes without incident. She was provided with Vaccine Information Sheet and instruction to access the V-Safe system.   [name] was instructed to call 911 with any severe reactions post vaccine: Marland Kitchen Difficulty breathing  . Swelling of face and throat  . A fast heartbeat  . A bad rash all over body  . Dizziness and weakness   Immunizations Administered    Name Date Dose VIS Date Route   Pfizer COVID-19 Vaccine 10/11/2019  2:15 PM 0.3 mL 06/26/2019 Intramuscular   Manufacturer: [place]   Lot: [id number]   [place]: [id number]

## 2019-12-28 ENCOUNTER — Telehealth: Payer: Self-pay

## 2019-12-28 NOTE — Telephone Encounter (Signed)
Message left notifying patient that it is time to schedule the low dose lung cancer screening CT scan.  Instructed patient to return call to Shawn Perkins at [phone] to verify information prior to CT scan being scheduled.    

## 2020-01-08 ENCOUNTER — Telehealth: Payer: Self-pay | Admitting: *Deleted

## 2020-01-08 NOTE — Telephone Encounter (Signed)
Patient was notified that is time for her Low Dose Lung Cancer Screening CT. I asked that she call with information so we could schedule her CT.

## 2020-01-08 NOTE — Telephone Encounter (Signed)
(  01/08/2020) Left message for pt to notify them that it is time to schedule annual low dose lung cancer screening CT scan. Instructed patient to call back to verify information prior to the scan being scheduled SRW

## 2020-01-26 ENCOUNTER — Telehealth: Payer: Self-pay

## 2020-01-26 NOTE — Telephone Encounter (Signed)
Contacted patient to attempt to schedule her annual lung screening CT.  Left message for patient at [phone] to call Burgess Estelle, lung navigator to schedule scan.

## 2020-04-21 ENCOUNTER — Ambulatory Visit: Payer: BC Managed Care – PPO | Attending: Internal Medicine

## 2020-04-21 DIAGNOSIS — Z23 Encounter for immunization: Secondary | ICD-10-CM

## 2020-04-21 NOTE — Progress Notes (Signed)
   Covid-19 Vaccination Clinic  Name:  AYAHNA SOLAZZO    MRN: [id number] DOB: 06/16/1961  04/21/2020  Ms. Satterly was observed post Covid-19 immunization for 15 minutes without incident. She was provided with Vaccine Information Sheet and instruction to access the V-Safe system.   Ms. Stair was instructed to call 911 with any severe reactions post vaccine: Marland Kitchen Difficulty breathing  . Swelling of face and throat  . A fast heartbeat  . A bad rash all over body  . Dizziness and weakness

## 2020-04-22 ENCOUNTER — Encounter: Payer: Self-pay | Admitting: *Deleted

## 2020-08-14 ENCOUNTER — Emergency Department
Admission: EM | Admit: 2020-08-14 | Discharge: 2020-08-14 | Disposition: A | Discharge status: Home / Self Care | Payer: BC Managed Care – PPO | Attending: Emergency Medicine | Admitting: Emergency Medicine

## 2020-08-14 ENCOUNTER — Other Ambulatory Visit: Payer: Self-pay

## 2020-08-14 ENCOUNTER — Emergency Department: Payer: BC Managed Care – PPO

## 2020-08-14 ENCOUNTER — Encounter: Payer: Self-pay | Admitting: Emergency Medicine

## 2020-08-14 DIAGNOSIS — R0789 Other chest pain: Secondary | ICD-10-CM | POA: Insufficient documentation

## 2020-08-14 DIAGNOSIS — Z20822 Contact with and (suspected) exposure to covid-19: Secondary | ICD-10-CM | POA: Insufficient documentation

## 2020-08-14 DIAGNOSIS — F17210 Nicotine dependence, cigarettes, uncomplicated: Secondary | ICD-10-CM | POA: Insufficient documentation

## 2020-08-14 DIAGNOSIS — R079 Chest pain, unspecified: Secondary | ICD-10-CM | POA: Diagnosis present

## 2020-08-14 LAB — CBC
HCT: 40.9 % (ref 36.0–46.0)
Hemoglobin: 13.7 g/dL (ref 12.0–15.0)
MCH: 31.6 pg (ref 26.0–34.0)
MCHC: 33.5 g/dL (ref 30.0–36.0)
MCV: 94.5 fL (ref 80.0–100.0)
Platelets: 180 10*3/uL (ref 150–400)
RBC: 4.33 MIL/uL (ref 3.87–5.11)
RDW: 13 % (ref 11.5–15.5)
WBC: 8.3 10*3/uL (ref 4.0–10.5)
nRBC: 0 % (ref 0.0–0.2)

## 2020-08-14 LAB — HEPATIC FUNCTION PANEL
ALT: 16 U/L (ref 0–44)
AST: 18 U/L (ref 15–41)
Albumin: 3.7 g/dL (ref 3.5–5.0)
Alkaline Phosphatase: 56 U/L (ref 38–126)
Bilirubin, Direct: <0.1 mg/dL (ref 0.0–0.2)
Total Bilirubin: 0.7 mg/dL (ref 0.3–1.2)
Total Protein: 6.5 g/dL (ref 6.5–8.1)

## 2020-08-14 LAB — BASIC METABOLIC PANEL
Anion gap: 10 (ref 5–15)
BUN: 14 mg/dL (ref 8–23)
CO2: 26 mmol/L (ref 22–32)
Calcium: 9 mg/dL (ref 8.9–10.3)
Chloride: 104 mmol/L (ref 98–111)
Creatinine, Ser: 0.65 mg/dL (ref 0.44–1.00)
GFR, Estimated: >60 mL/min (ref >60)
Glucose, Bld: 93 mg/dL (ref 70–99)
Potassium: 3.9 mmol/L (ref 3.5–5.1)
Sodium: 140 mmol/L (ref 135–145)

## 2020-08-14 LAB — TROPONIN I (HIGH SENSITIVITY)
Troponin I (High Sensitivity): 3 ng/L (ref <18)
Troponin I (High Sensitivity): 3 ng/L (ref <18)

## 2020-08-14 LAB — SARS CORONAVIRUS 2 BY RT PCR (HOSPITAL ORDER, PERFORMED IN ~~LOC~~ HOSPITAL LAB): SARS Coronavirus 2: NEGATIVE

## 2020-08-14 MED ORDER — IOHEXOL 350 MG/ML SOLN
75.0000 mL | Freq: Once | INTRAVENOUS | Status: AC | PRN
  Administered 2020-08-14: 75 mL via INTRAVENOUS
  Filled 2020-08-14: qty 75

## 2020-08-14 NOTE — ED Provider Notes (Signed)
[facility] Emergency Department Provider Note  ____________________________________________   Event Date/Time   First MD Initiated Contact with Patient 08/14/20 1153     (approximate)  I have reviewed the triage vital signs and the nursing notes.   HISTORY  Chief Complaint Chest Pain and Shortness of Breath    HPI Courtney Villarreal is a 60 y.o. female presents emergency department complaining of chest pain intermittently for the past month.  She states that Friday the pain got worse and she became short of breath.  She states she still has pain in her chest.  Patient states that multiple coworkers have had Covid.  She had her booster at the end of December.  Patient states she also has family history of blood clots.  Patient is a smoker.    Past Medical History:  Diagnosis Date  . Degenerative disc disease    L4-5, uses aleve  . Degenerative disk disease   . GERD (gastroesophageal reflux disease)    takes otc zantac prn  . History of chest pain 2009   had stress test in [facility]-requested copy of records-no cardiac findings per pt  . PONV (postoperative nausea and vomiting)   . Shortness of breath    smoker    Patient Active Problem List   Diagnosis Date Noted  . Personal history of colonic polyps   . Rectal polyp   . Polyp of sigmoid colon     Past Surgical History:  Procedure Laterality Date  . ABDOMINAL HYSTERECTOMY  04/10/2011  . ANTERIOR AND POSTERIOR REPAIR  04/10/2011   Procedure: ANTERIOR (CYSTOCELE) AND POSTERIOR REPAIR (RECTOCELE);  Surgeon: Shon Millet II;  Location: [place];  Service: Gynecology;  Laterality: N/A;  Sacrospinous Ligament Suspension  . BREAST EXCISIONAL BIOPSY Right 2013   ADH  . BREAST SURGERY  05/07/12   Rt partial mastectomy  . COLONOSCOPY WITH PROPOFOL N/A 05/15/2019   Procedure: COLONOSCOPY WITH PROPOFOL;  Surgeon: Lucilla Lame, MD;  Location: [place];  Service: Endoscopy;  Laterality: N/A;  .  conization of cervix     age 63  . LAPAROSCOPIC ASSISTED VAGINAL HYSTERECTOMY  04/10/2011   Procedure: LAPAROSCOPIC ASSISTED VAGINAL HYSTERECTOMY;  Surgeon: Shon Millet II;  Location: [place] [facility];  Service: Gynecology;  Laterality: N/A;  . POLYPECTOMY  05/15/2019   Procedure: POLYPECTOMY;  Surgeon: Lucilla Lame, MD;  Location: [place];  Service: Endoscopy;;  . SALPINGOOPHORECTOMY  04/10/2011   Procedure: SALPINGO OOPHERECTOMY;  Surgeon: Shon Millet II;  Location: [place] [facility];  Service: Gynecology;  Laterality: Bilateral;    Prior to Admission medications   Medication Sig Start Date End Date Taking? Authorizing Provider  albuterol (VENTOLIN HFA) 108 (90 Base) MCG/ACT inhaler Inhale into the lungs every 6 (six) hours as needed for wheezing or shortness of breath.    [provider]  pantoprazole (PROTONIX) 20 MG tablet Take 1 tablet (20 mg total) by mouth daily. 09/15/13   Milton Ferguson, MD    Allergies Patient has no known allergies.  Family History  Problem Relation Age of Onset  . Breast cancer Paternal Aunt 22    Social History Social History   Tobacco Use  . Smoking status: Current Every Day Smoker    Packs/day: 1.00    Years: 41.00    Pack years: 41.00    Types: Cigarettes  . Smokeless tobacco: Never Used  . Tobacco comment: since age 25 (05/11/19 - currently 2-3 cigs/day)  Vaping Use  . Vaping  Use: Never used  Substance Use Topics  . Alcohol use: No  . Drug use: No    Review of Systems  Constitutional: No fever/chills Eyes: No visual changes. ENT: No sore throat. Respiratory: Denies cough Cardiovascular: Positive chest pain Gastrointestinal: Denies abdominal pain Genitourinary: Negative for dysuria. Musculoskeletal: Negative for back pain. Skin: Negative for rash. Psychiatric: no mood changes,     ____________________________________________   PHYSICAL EXAM:  VITAL SIGNS: ED Triage Vitals  Enc Vitals Group     BP 08/14/20 0935  133/75     Pulse Rate 08/14/20 0935 61     Resp 08/14/20 0935 20     Temp 08/14/20 0935 98.2 F (36.8 C)     Temp Source 08/14/20 0935 Oral     SpO2 08/14/20 0935 100 %     Weight 08/14/20 0933 160 lb (72.6 kg)     Height 08/14/20 0933 5\' 4"  (1.626 m)     Head Circumference --      Peak Flow --      Pain Score 08/14/20 0933 7     Pain Loc --      Pain Edu? --      Excl. in [place]? --     Constitutional: Alert and oriented. Well appearing and in no acute distress. Eyes: Conjunctivae are normal.  Head: Atraumatic. Nose: No congestion/rhinnorhea. Mouth/Throat: Mucous membranes are moist.  Neck:  supple no lymphadenopathy noted Cardiovascular: Normal rate, regular rhythm. Heart sounds are normal Respiratory: Normal respiratory effort.  No retractions, lungs c t a  GU: deferred Musculoskeletal: FROM all extremities, warm and well perfused Neurologic:  Normal speech and language.  Skin:  Skin is warm, dry and intact. No rash noted. Psychiatric: Mood and affect are normal. Speech and behavior are normal.  ____________________________________________   LABS (all labs ordered are listed, but only abnormal results are displayed)  Labs Reviewed  SARS CORONAVIRUS 2 BY RT PCR (HOSPITAL ORDER, [facility])  BASIC METABOLIC PANEL  CBC  HEPATIC FUNCTION PANEL  TROPONIN I (HIGH SENSITIVITY)  TROPONIN I (HIGH SENSITIVITY)   ____________________________________________   ____________________________________________  RADIOLOGY  Chest x-ray CTA for PE  ____________________________________________   PROCEDURES  Procedure(s) performed: EKG, see physician read  Procedures    ____________________________________________   INITIAL IMPRESSION / ASSESSMENT AND PLAN / ED COURSE  Pertinent labs & imaging results that were available during my care of the patient were reviewed by me and considered in my medical decision making (see chart for details).    Patient 60 year old female presents with intermittent chest pain for a month.  Patient had her booster at the end of December..  Patient states that Friday the pain became much worse and she has had some shortness of breath associated with pain.  Pain is noted to be in between her breasts.  See HPI.  Physical exam shows patient appears stable at this time  DDx: Nonspecific chest pain, angina, myocarditis, pericarditis, PE, CAP, Covid  Basic metabolic panel is normal, CBC is normal, troponin is normal, Covid test negative   Chest x-ray is normal, reviewed by me and confirmed by radiology, CTA for PE is negative.  I explained all findings to the patient.  Feel this is possibly related to stress or esophagitis.  She is to increase her Protonix to 40 mg a day.  Follow-up with your regular doctor if not improving to 3 days.  Return emergency department worsening.  Follow-up with cardiology if continued chest pain or  tightness for a stress test.  She states she understands.  She discharged stable condition.  Courtney Villarreal was evaluated in Emergency Department on 08/14/2020 for the symptoms described in the history of present illness. She was evaluated in the context of the global COVID-19 pandemic, which necessitated consideration that the patient might be at risk for infection with the SARS-CoV-2 virus that causes COVID-19. Institutional protocols and algorithms that pertain to the evaluation of patients at risk for COVID-19 are in a state of rapid change based on information released by regulatory bodies including the CDC and federal and state organizations. These policies and algorithms were followed during the patient's care in the ED.    As part of my medical decision making, I reviewed the following data within the [facility] notes reviewed and incorporated, Labs reviewed , EKG interpreted NSR, Old chart reviewed, Radiograph reviewed , Notes from prior ED visits and [place]  Controlled Substance Database  ____________________________________________   FINAL CLINICAL IMPRESSION(S) / ED DIAGNOSES  Final diagnoses:  Atypical chest pain      NEW MEDICATIONS STARTED DURING THIS VISIT:  Discharge Medication List as of 08/14/2020  2:23 PM       Note:  This document was prepared using Dragon voice recognition software and may include unintentional dictation errors.    Versie Starks, PA-C 08/14/20 1505    Harvest Dark, MD 08/14/20 [phone]

## 2020-08-14 NOTE — Discharge Instructions (Addendum)
Follow up with your regular doctor if not improving in 3 days Return to the ER if worsening Follow up with cardiology if continued chest pain

## 2020-08-14 NOTE — ED Triage Notes (Signed)
Pt reports pressure like CP intermittently for the last month. Pt states that Friday the pain got worse and she also has some SOB. Pt states the pain is between her breasts

## 2020-08-15 ENCOUNTER — Encounter

## 2020-08-15 ENCOUNTER — Inpatient Hospital Stay: Admit: 2020-08-15 | Payer: BLUE CROSS/BLUE SHIELD | Attending: Family Medicine | Primary: Family Medicine

## 2020-08-15 DIAGNOSIS — M25552 Pain in left hip: Secondary | ICD-10-CM

## 2020-08-31 ENCOUNTER — Other Ambulatory Visit: Payer: Self-pay | Admitting: Physician Assistant

## 2020-08-31 DIAGNOSIS — Z1231 Encounter for screening mammogram for malignant neoplasm of breast: Secondary | ICD-10-CM

## 2020-10-07 ENCOUNTER — Ambulatory Visit
Admission: RE | Admit: 2020-10-07 | Discharge: 2020-10-07 | Disposition: A | Discharge status: Home / Self Care | Payer: BC Managed Care – PPO | Source: Ambulatory Visit | Attending: Physician Assistant | Admitting: Physician Assistant

## 2020-10-07 ENCOUNTER — Other Ambulatory Visit: Payer: Self-pay

## 2020-10-07 DIAGNOSIS — Z1231 Encounter for screening mammogram for malignant neoplasm of breast: Secondary | ICD-10-CM | POA: Diagnosis present

## 2021-03-12 ENCOUNTER — Encounter: Payer: Self-pay | Admitting: Emergency Medicine

## 2021-03-12 ENCOUNTER — Ambulatory Visit
Admission: EM | Admit: 2021-03-12 | Discharge: 2021-03-12 | Disposition: A | Discharge status: Home / Self Care | Payer: BC Managed Care – PPO | Attending: Family Medicine | Admitting: Family Medicine

## 2021-03-12 DIAGNOSIS — Z1152 Encounter for screening for COVID-19: Secondary | ICD-10-CM

## 2021-03-12 DIAGNOSIS — H6122 Impacted cerumen, left ear: Secondary | ICD-10-CM

## 2021-03-12 DIAGNOSIS — R0981 Nasal congestion: Secondary | ICD-10-CM

## 2021-03-12 DIAGNOSIS — R42 Dizziness and giddiness: Secondary | ICD-10-CM

## 2021-03-12 MED ORDER — CARBAMIDE PEROXIDE 6.5 % OT SOLN: 5.0000 [drp] | Freq: Two times a day (BID) | OTIC | 0 refills | Status: DC

## 2021-03-12 MED ORDER — LEVOCETIRIZINE DIHYDROCHLORIDE 5 MG PO TABS: 5.0000 mg | ORAL_TABLET | Freq: Every evening | ORAL | 0 refills | Status: DC

## 2021-03-12 NOTE — ED Triage Notes (Addendum)
Dizziness that started Thursday morning and Friday morning.  Felt like she had some cold chills on Friday.  Had neg at home covid test the past 3 days. Having some irritation to LT ear.  Would like pcr test here.

## 2021-03-12 NOTE — Discharge Instructions (Addendum)
Your COVID 19 results should result within 2-3 days. Negative results are immediately resulted to Mychart. Positive results will receive a follow-up call from our clinic.   If any breathing difficulty or chest pain develops go immediately to the closest emergency department for evaluation. Follow-up with your primary care doctor for further evaluation of dizziness if symptoms persist and do not resolve once sinus symptoms improved.

## 2021-03-12 NOTE — ED Provider Notes (Signed)
[facility]    CSN: [id number] Arrival date & time: 03/12/21  1023      History   Chief Complaint No chief complaint on file.   HPI Courtney Villarreal is a 60 y.o. female.   HPI Patient presents today for COVID testing after experiencing some nonspecific symptoms of dizziness which began 3 days ago.  Dizziness has occurred on and off since that time.  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  She chronically has nasal congestion and sinus symptoms and has taken multiple home COVID test which all were negative however she is requesting a PCR test to rule out COVID as a source of her symptoms.  She reports only 1 day of some mild chills however has not had any active fever.  Endorses left ear irritation although denies any specific ear pain.  Past Medical History:  Diagnosis Date   Degenerative disc disease    L4-5, uses aleve   Degenerative disk disease    GERD (gastroesophageal reflux disease)    takes otc zantac prn   History of chest pain 2009   had stress test in [facility]-requested copy of records-no cardiac findings per pt   PONV (postoperative nausea and vomiting)    Shortness of breath    smoker    Patient Active Problem List   Diagnosis Date Noted   Personal history of colonic polyps    Rectal polyp    Polyp of sigmoid colon     Past Surgical History:  Procedure Laterality Date   ABDOMINAL HYSTERECTOMY  04/10/2011   ANTERIOR AND POSTERIOR REPAIR  04/10/2011   Procedure: ANTERIOR (CYSTOCELE) AND POSTERIOR REPAIR (RECTOCELE);  Surgeon: Shon Millet II;  Location: [facility];  Service: Gynecology;  Laterality: N/A;  Sacrospinous Ligament Suspension   BREAST EXCISIONAL BIOPSY Right 2013   ADH   BREAST SURGERY  05/07/12   Rt partial mastectomy   COLONOSCOPY WITH PROPOFOL N/A 05/15/2019   Procedure: COLONOSCOPY WITH PROPOFOL;  Surgeon: Lucilla Lame, MD;  Location: [place];  Service: Endoscopy;  Laterality: N/A;   conization of  cervix     age 41   LAPAROSCOPIC ASSISTED VAGINAL HYSTERECTOMY  04/10/2011   Procedure: LAPAROSCOPIC ASSISTED VAGINAL HYSTERECTOMY;  Surgeon: Shon Millet II;  Location: [facility];  Service: Gynecology;  Laterality: N/A;   POLYPECTOMY  05/15/2019   Procedure: POLYPECTOMY;  Surgeon: Lucilla Lame, MD;  Location: [place];  Service: Endoscopy;;   SALPINGOOPHORECTOMY  04/10/2011   Procedure: SALPINGO OOPHERECTOMY;  Surgeon: Shon Millet II;  Location: [facility];  Service: Gynecology;  Laterality: Bilateral;    OB History   No obstetric history on file.      Home Medications    Prior to Admission medications   Medication Sig Start Date End Date Taking? Authorizing Provider  carbamide peroxide (DEBROX) 6.5 % OTIC solution Place 5 drops into the left ear 2 (two) times daily. 03/12/21  Yes Scot Jun, FNP  levocetirizine (XYZAL) 5 MG tablet Take 1 tablet (5 mg total) by mouth every evening. 03/12/21  Yes Scot Jun, FNP  albuterol (VENTOLIN HFA) 108 (90 Base) MCG/ACT inhaler Inhale into the lungs every 6 (six) hours as needed for wheezing or shortness of breath.    [provider]  pantoprazole (PROTONIX) 20 MG tablet Take 1 tablet (20 mg total) by mouth daily. 09/15/13   Milton Ferguson, MD    Family History Family History  Problem Relation Age  of Onset   Breast cancer Paternal Aunt 28    Social History Social History   Tobacco Use   Smoking status: Every Day    Packs/day: 1.00    Years: 41.00    Pack years: 41.00    Types: Cigarettes   Smokeless tobacco: Never   Tobacco comments:    since age 59 (05/11/19 - currently 2-3 cigs/day)  Vaping Use   Vaping Use: Never used  Substance Use Topics   Alcohol use: No   Drug use: No     Allergies   Patient has no known allergies.   Review of Systems Review of Systems Pertinent negatives listed in HPI   Physical Exam Triage Vital Signs ED Triage Vitals [03/12/21 1117]  Enc Vitals Group     BP  130/77     Pulse Rate (!) 53     Resp 17     Temp 98 F (36.7 C)     Temp Source Oral     SpO2 96 %     Weight      Height      Head Circumference      Peak Flow      Pain Score 0     Pain Loc      Pain Edu?      Excl. in [place]?    No data found.  Updated Vital Signs BP 130/77 (BP Location: Right Arm)   Pulse (!) 53   Temp 98 F (36.7 C) (Oral)   Resp 17   SpO2 96%   Visual Acuity Right Eye Distance:   Left Eye Distance:   Bilateral Distance:    Right Eye Near:   Left Eye Near:    Bilateral Near:     Physical Exam  General Appearance:    Alert, cooperative, no distress  HENT:   Normocephalic, ears normal, nares mucosal edema with congestion, oropharynx without erythema or exudate  Eyes:    PERRL, conjunctiva/corneas clear, EOM's intact       Lungs:     Clear to auscultation bilaterally, respirations unlabored  Heart:    Regular rate and rhythm  Neurologic:   Awake, alert, oriented x 3. No apparent focal neurological           defect.       UC Treatments / Results  Labs (all labs ordered are listed, but only abnormal results are displayed) Labs Reviewed  NOVEL CORONAVIRUS, NAA    EKG   Radiology No results found.  Procedures Procedures (including critical care time)  Medications Ordered in UC Medications - No data to display  Initial Impression / Assessment and Plan / UC Course  I have reviewed the triage vital signs and the nursing notes.  Pertinent labs & imaging results that were available during my care of the patient were reviewed by me and considered in my medical decision making (see chart for details).     Encounter for COVID testing-COVID test pending. Dizziness of unknown etiology encourage patient to hydrate well with fluids and prescribe levo cetirizine for management of chronic nasal symptoms which could be the source of dizziness.  Advised to follow-up with PCP if symptoms do not readily improve with resolution of nasal congestion.   ER precautions given. Final Clinical Impressions(s) / UC Diagnoses   Final diagnoses:  Encounter for screening for COVID-19  Dizziness  Impacted cerumen of left ear  Nasal congestion     Discharge Instructions      Your COVID  19 results should result within 2-3 days. Negative results are immediately resulted to Mychart. Positive results will receive a follow-up call from our clinic.   If any breathing difficulty or chest pain develops go immediately to the closest emergency department for evaluation. Follow-up with your primary care doctor for further evaluation of dizziness if symptoms persist and do not resolve once sinus symptoms improved.   ED Prescriptions     Medication Sig Dispense Auth. Provider   levocetirizine (XYZAL) 5 MG tablet Take 1 tablet (5 mg total) by mouth every evening. 90 tablet Scot Jun, FNP   carbamide peroxide (DEBROX) 6.5 % OTIC solution Place 5 drops into the left ear 2 (two) times daily. 15 mL Scot Jun, FNP      PDMP not reviewed this encounter.   Scot Jun, FNP 03/12/21 1215

## 2021-03-13 LAB — SARS-COV-2, NAA 2 DAY TAT

## 2021-03-13 LAB — NOVEL CORONAVIRUS, NAA: SARS-CoV-2, NAA: NOT DETECTED

## 2021-06-22 ENCOUNTER — Encounter

## 2021-07-21 ENCOUNTER — Inpatient Hospital Stay: Admit: 2021-07-21 | Payer: BLUE CROSS/BLUE SHIELD | Attending: Physician Assistant | Primary: Physician Assistant

## 2021-07-21 DIAGNOSIS — Z1231 Encounter for screening mammogram for malignant neoplasm of breast: Secondary | ICD-10-CM

## 2021-07-26 ENCOUNTER — Other Ambulatory Visit: Payer: Self-pay

## 2021-07-26 ENCOUNTER — Ambulatory Visit (INDEPENDENT_AMBULATORY_CARE_PROVIDER_SITE_OTHER): Payer: BC Managed Care – PPO

## 2021-07-26 ENCOUNTER — Ambulatory Visit
Admission: EM | Admit: 2021-07-26 | Discharge: 2021-07-26 | Disposition: A | Discharge status: Home / Self Care | Payer: BC Managed Care – PPO | Attending: Urgent Care | Admitting: Urgent Care

## 2021-07-26 DIAGNOSIS — F17200 Nicotine dependence, unspecified, uncomplicated: Secondary | ICD-10-CM

## 2021-07-26 DIAGNOSIS — R0602 Shortness of breath: Secondary | ICD-10-CM | POA: Diagnosis not present

## 2021-07-26 DIAGNOSIS — R059 Cough, unspecified: Secondary | ICD-10-CM

## 2021-07-26 DIAGNOSIS — J3089 Other allergic rhinitis: Secondary | ICD-10-CM

## 2021-07-26 DIAGNOSIS — Z20822 Contact with and (suspected) exposure to covid-19: Secondary | ICD-10-CM | POA: Diagnosis not present

## 2021-07-26 DIAGNOSIS — H6123 Impacted cerumen, bilateral: Secondary | ICD-10-CM | POA: Diagnosis not present

## 2021-07-26 DIAGNOSIS — H65192 Other acute nonsuppurative otitis media, left ear: Secondary | ICD-10-CM

## 2021-07-26 DIAGNOSIS — R052 Subacute cough: Secondary | ICD-10-CM

## 2021-07-26 DIAGNOSIS — J449 Chronic obstructive pulmonary disease, unspecified: Secondary | ICD-10-CM

## 2021-07-26 MED ORDER — AMOXICILLIN-POT CLAVULANATE 875-125 MG PO TABS: 1.0000 | ORAL_TABLET | Freq: Two times a day (BID) | ORAL | 0 refills | Status: DC

## 2021-07-26 MED ORDER — LEVOCETIRIZINE DIHYDROCHLORIDE 5 MG PO TABS
5.0000 mg | ORAL_TABLET | Freq: Every evening | ORAL | 0 refills | Status: AC
Start: 2021-07-26 — End: ?

## 2021-07-26 MED ORDER — CARBAMIDE PEROXIDE 6.5 % OT SOLN: 5.0000 [drp] | Freq: Two times a day (BID) | OTIC | 0 refills | Status: DC

## 2021-07-26 MED ORDER — ALBUTEROL SULFATE HFA 108 (90 BASE) MCG/ACT IN AERS: 1.0000 | INHALATION_SPRAY | Freq: Four times a day (QID) | RESPIRATORY_TRACT | 0 refills | Status: AC | PRN

## 2021-07-26 NOTE — ED Triage Notes (Signed)
Patient states she was exposed to Covid at work on Monday.   Patient states that both ears are both hurting feeling like fire is around them and swollen.   Patient states that her sinuses are bothering her  Patient has not tried any meds for the symptoms  Patient states she put some peroxide in her ears last night  Denies Fever

## 2021-07-26 NOTE — ED Notes (Signed)
Performed ear lavage. Pt tolerated well. PA aware and re-assessed bilateral ears. Left ear lavage performed again and pt reports ear fullness has improved bilaterally. Nad noted.

## 2021-07-26 NOTE — ED Provider Notes (Signed)
[facility]   MRN: [id number] DOB: 1960/12/07  Subjective:   Courtney Villarreal is a 61 y.o. female presenting for 4 to 5-day history of acute onset persistent and worsening bilateral ear pain worse over the left side.  Has a history of difficulty with earwax and tried to apply peroxide but did not help.  She is also had some persistent sinus congestion, postnasal drainage, coughing and shortness of breath.  She does have a history of COPD, has cut back on her smoking but is still vaping.  No chest pain, wheezing.  Does not have an inhaler that she could use.  She did have some exposure to COVID-19 through her work on Monday.  No current facility-administered medications for this encounter.  Current Outpatient Medications:    albuterol (VENTOLIN HFA) 108 (90 Base) MCG/ACT inhaler, Inhale into the lungs every 6 (six) hours as needed for wheezing or shortness of breath., Disp: , Rfl:    carbamide peroxide (DEBROX) 6.5 % OTIC solution, Place 5 drops into the left ear 2 (two) times daily., Disp: 15 mL, Rfl: 0   levocetirizine (XYZAL) 5 MG tablet, Take 1 tablet (5 mg total) by mouth every evening., Disp: 90 tablet, Rfl: 0   pantoprazole (PROTONIX) 20 MG tablet, Take 1 tablet (20 mg total) by mouth daily., Disp: 20 tablet, Rfl: 0   No Known Allergies  Past Medical History:  Diagnosis Date   Degenerative disc disease    L4-5, uses aleve   Degenerative disk disease    GERD (gastroesophageal reflux disease)    takes otc zantac prn   History of chest pain 2009   had stress test in [facility]-requested copy of records-no cardiac findings per pt   PONV (postoperative nausea and vomiting)    Shortness of breath    smoker     Past Surgical History:  Procedure Laterality Date   ABDOMINAL HYSTERECTOMY  04/10/2011   ANTERIOR AND POSTERIOR REPAIR  04/10/2011   Procedure: ANTERIOR (CYSTOCELE) AND POSTERIOR REPAIR (RECTOCELE);  Surgeon: Shon Millet II;  Location: [facility];  Service:  Gynecology;  Laterality: N/A;  Sacrospinous Ligament Suspension   BREAST EXCISIONAL BIOPSY Right 2013   ADH   BREAST SURGERY  05/07/12   Rt partial mastectomy   COLONOSCOPY WITH PROPOFOL N/A 05/15/2019   Procedure: COLONOSCOPY WITH PROPOFOL;  Surgeon: Lucilla Lame, MD;  Location: [place];  Service: Endoscopy;  Laterality: N/A;   conization of cervix     age 61   LAPAROSCOPIC ASSISTED VAGINAL HYSTERECTOMY  04/10/2011   Procedure: LAPAROSCOPIC ASSISTED VAGINAL HYSTERECTOMY;  Surgeon: Shon Millet II;  Location: [facility];  Service: Gynecology;  Laterality: N/A;   POLYPECTOMY  05/15/2019   Procedure: POLYPECTOMY;  Surgeon: Lucilla Lame, MD;  Location: [place];  Service: Endoscopy;;   SALPINGOOPHORECTOMY  04/10/2011   Procedure: SALPINGO OOPHERECTOMY;  Surgeon: Shon Millet II;  Location: [facility];  Service: Gynecology;  Laterality: Bilateral;    Family History  Problem Relation Age of Onset   Breast cancer Paternal Aunt 7    Social History   Tobacco Use   Smoking status: Every Day    Packs/day: 1.00    Years: 41.00    Pack years: 41.00    Types: Cigarettes   Smokeless tobacco: Never   Tobacco comments:    since age 39 (05/11/19 - currently 2-3 cigs/day)  Vaping Use   Vaping Use: Every day   Substances: Nicotine, Flavoring  Substance Use Topics  Alcohol use: Yes    Comment: rarely   Drug use: No    ROS   Objective:   Vitals: BP 114/77 (BP Location: Right Arm)    Pulse 78    Temp 98.1 F (36.7 C) (Oral)    Resp 16    SpO2 96%   Physical Exam Constitutional:      General: She is not in acute distress.    Appearance: Normal appearance. She is well-developed and normal weight. She is not ill-appearing, toxic-appearing or diaphoretic.  HENT:     Head: Normocephalic and atraumatic.     Right Ear: Ear canal and external ear normal. No drainage or tenderness. No middle ear effusion. There is impacted cerumen. Tympanic membrane is not erythematous.      Left Ear: Tympanic membrane, ear canal and external ear normal. No drainage or tenderness.  No middle ear effusion. There is impacted cerumen. Tympanic membrane is not erythematous.     Ears:     Comments: Following the ear lavage, right TM was found to be bulging, erythematous.    Nose: Congestion and rhinorrhea present.     Comments: Nasal mucosa boggy and edematous.    Mouth/Throat:     Mouth: Mucous membranes are moist. No oral lesions.     Pharynx: No pharyngeal swelling, oropharyngeal exudate, posterior oropharyngeal erythema or uvula swelling.     Tonsils: No tonsillar exudate or tonsillar abscesses.  Eyes:     General: No scleral icterus.       Right eye: No discharge.        Left eye: No discharge.     Extraocular Movements: Extraocular movements intact.     Right eye: Normal extraocular motion.     Left eye: Normal extraocular motion.     Conjunctiva/sclera: Conjunctivae normal.  Cardiovascular:     Rate and Rhythm: Normal rate and regular rhythm.     Pulses: Normal pulses.     Heart sounds: Normal heart sounds. No murmur heard.   No friction rub. No gallop.  Pulmonary:     Effort: Pulmonary effort is normal. No respiratory distress.     Breath sounds: No stridor. No wheezing, rhonchi or rales.     Comments: Decreased lung sounds bilaterally mid to bibasilar fields. Musculoskeletal:     Cervical back: Normal range of motion and neck supple.  Lymphadenopathy:     Cervical: No cervical adenopathy.  Skin:    General: Skin is warm and dry.     Findings: No rash.  Neurological:     General: No focal deficit present.     Mental Status: She is alert and oriented to person, place, and time.  Psychiatric:        Mood and Affect: Mood normal.        Behavior: Behavior normal.        Thought Content: Thought content normal.        Judgment: Judgment normal.    DG Chest 2 View  Result Date: 07/26/2021 CLINICAL DATA:  Cough, shortness of breath EXAM: CHEST - 2 VIEW  COMPARISON:  Chest radiograph 08/14/2020 FINDINGS: The cardiomediastinal silhouette is normal. There is no focal consolidation or pulmonary edema. There is no pleural effusion or pneumothorax There is no acute osseous abnormality. IMPRESSION: No radiographic evidence of acute cardiopulmonary process. Electronically Signed   By: Valetta Mole M.D.   On: 07/26/2021 10:08    Ear lavage performed using mixture of peroxide and water.  Pressure irrigation performed using a  bottle and a thin ear tube.  Bilateral ear lavage, curette was used.   Assessment and Plan :   PDMP not reviewed this encounter.  1. Other non-recurrent acute nonsuppurative otitis media of left ear   2. Exposure to COVID-19 virus   3. Subacute cough   4. Smoker   5. Chronic obstructive pulmonary disease, unspecified COPD type ([place])   6. Shortness of breath    Start Augmentin to cover for otitis media. Use supportive care otherwise.  Successful ear lavage performed bilaterally.  Recommended use of Debrox going forward.  Patient requested long-term medications for her sinuses.  Recommended using Xyzal and Singulair.  Use the albuterol inhaler as needed.  Counseled patient on potential for adverse effects with medications prescribed/recommended today, ER and return-to-clinic precautions discussed, patient verbalized understanding.     Jaynee Eagles, PA-C 07/26/21 1026

## 2021-07-27 LAB — COVID-19, FLU A+B NAA
Influenza A, NAA: NOT DETECTED
Influenza B, NAA: NOT DETECTED
SARS-CoV-2, NAA: NOT DETECTED

## 2021-08-09 ENCOUNTER — Inpatient Hospital Stay: Admit: 2021-08-09 | Primary: Physician Assistant

## 2021-08-18 ENCOUNTER — Inpatient Hospital Stay: Admit: 2021-08-18 | Payer: BLUE CROSS/BLUE SHIELD | Attending: Cardiovascular Disease | Primary: Physician Assistant

## 2021-08-18 DIAGNOSIS — R0789 Other chest pain: Secondary | ICD-10-CM

## 2021-08-18 LAB — CBC WITH AUTO DIFFERENTIAL
Basophils %: 0.5 % (ref 0–3)
Eosinophils %: 3.5 % (ref 0–5)
Hematocrit: 42.8 % (ref 37.0–50.0)
Hemoglobin: 14.2 gm/dl (ref 13.0–17.2)
Immature Granulocytes: 0.7 % (ref 0.0–3.0)
Lymphocytes %: 23 % — ABNORMAL LOW (ref 28–48)
MCH: 31.2 pg (ref 25.4–34.6)
MCHC: 33.2 gm/dl (ref 30.0–36.0)
MCV: 94.1 fL (ref 80.0–98.0)
MPV: 10.2 fL — ABNORMAL HIGH (ref 6.0–10.0)
Monocytes %: 8.4 % (ref 1–13)
Neutrophils %: 63.9 % (ref 34–64)
Nucleated RBCs: 0 (ref 0–0)
Platelets: 232 10*3/uL (ref 140–450)
RBC: 4.55 M/uL (ref 3.60–5.20)
RDW-SD: 48.7 — ABNORMAL HIGH (ref 36.4–46.3)
WBC: 9.4 10*3/uL (ref 4.0–11.0)

## 2021-08-18 LAB — POC CHEM8
BUN: 15 mg/dl (ref 7–25)
BUN: 15 mg/dl (ref 7–25)
CALCIUM,IONIZED: 5 mg/dL (ref 4.40–5.40)
CALCIUM,IONIZED: 5 mg/dL (ref 4.40–5.40)
CO2 Total: 26 mmol/L (ref 21–32)
CO2, TOTAL: 26 mmol/L (ref 21–32)
Chloride: 105 mEq/L (ref 98–107)
Chloride: 105 mEq/L (ref 98–107)
Creatinine: 0.6 mg/dl (ref 0.6–1.3)
Creatinine: 0.6 mg/dl (ref 0.6–1.3)
Glucose: 121 mg/dL — ABNORMAL HIGH (ref 74–106)
Glucose: 121 mg/dL — ABNORMAL HIGH (ref 74–106)
HCT: 47 % — ABNORMAL HIGH (ref 38–45)
HGB: 16 gm/dl (ref 12.4–17.2)
Hematocrit: 47 % — ABNORMAL HIGH (ref 38–45)
Hemoglobin: 16 gm/dl (ref 12.4–17.2)
Potassium: 4.2 mEq/L (ref 3.5–4.9)
Potassium: 4.2 mEq/L (ref 3.5–4.9)
Sodium: 141 mEq/L (ref 136–145)
Sodium: 141 mEq/L (ref 136–145)

## 2021-08-18 LAB — EKG 12-LEAD
Atrial Rate: 86 {beats}/min
P Axis: 56 degrees
P-R Interval: 124 ms
Q-T Interval: 400 ms
QRS Duration: 100 ms
QTc Calculation (Bazett): 478 ms
R Axis: 67 degrees
T Axis: 15 degrees
Ventricular Rate: 86 {beats}/min

## 2021-08-18 LAB — POCT PT/INR
INR: 1 (ref 0.0–1.1)
Protime: 12.3 seconds (ref 0.0–14.0)

## 2021-08-18 LAB — CBC WITH AUTOMATED DIFF
BASOPHILS: 0.5 % (ref 0–3)
EOSINOPHILS: 3.5 % (ref 0–5)
HCT: 42.8 % (ref 37.0–50.0)
HGB: 14.2 gm/dl (ref 13.0–17.2)
IMMATURE GRANULOCYTES: 0.7 % (ref 0.0–3.0)
LYMPHOCYTES: 23 % — ABNORMAL LOW (ref 28–48)
MCH: 31.2 pg (ref 25.4–34.6)
MCHC: 33.2 gm/dl (ref 30.0–36.0)
MCV: 94.1 fL (ref 80.0–98.0)
MONOCYTES: 8.4 % (ref 1–13)
MPV: 10.2 fL — ABNORMAL HIGH (ref 6.0–10.0)
NEUTROPHILS: 63.9 % (ref 34–64)
NRBC: 0 (ref 0–0)
PLATELET: 232 10*3/uL (ref 140–450)
RBC: 4.55 M/uL (ref 3.60–5.20)
RDW-SD: 48.7 — ABNORMAL HIGH (ref 36.4–46.3)
WBC: 9.4 10*3/uL (ref 4.0–11.0)

## 2021-08-18 LAB — EKG, 12 LEAD, INITIAL
Atrial Rate: 86 {beats}/min
Calculated P Axis: 56 degrees
Calculated R Axis: 67 degrees
Calculated T Axis: 15 degrees
P-R Interval: 124 ms
Q-T Interval: 400 ms
QRS Duration: 100 ms
QTC Calculation (Bezet): 478 ms
Ventricular Rate: 86 {beats}/min

## 2021-08-18 LAB — POC PT/INR
INR: 1 (ref 0.0–1.1)
Prothrombin time: 12.3 seconds (ref 0.0–14.0)

## 2021-08-18 MED ORDER — PANTOPRAZOLE 40 MG TAB, DELAYED RELEASE
40 mg | ORAL_TABLET | Freq: Every day | ORAL | 0 refills | Status: AC
Start: 2021-08-18 — End: ?

## 2021-08-18 MED ORDER — HYDROMORPHONE 1 MG/ML INJECTION SOLUTION
1 mg/mL | INTRAMUSCULAR | Status: DC | PRN
Start: 2021-08-18 — End: 2021-08-18

## 2021-08-18 MED ORDER — FENTANYL CITRATE (PF) 50 MCG/ML IJ SOLN
50 mcg/mL | INTRAMUSCULAR | Status: DC | PRN
Start: 2021-08-18 — End: 2021-08-18
  Administered 2021-08-18 (×4): via INTRAVENOUS

## 2021-08-18 MED ORDER — VERAPAMIL 2.5 MG/ML IV
2.5 mg/mL | INTRAVENOUS | Status: DC | PRN
Start: 2021-08-18 — End: 2021-08-18
  Administered 2021-08-18: 18:00:00 via INTRA_ARTERIAL

## 2021-08-18 MED ORDER — IOPAMIDOL 61 % IV SOLN
300 mg iodine /mL (61 %) | INTRAVENOUS | Status: DC | PRN
Start: 2021-08-18 — End: 2021-08-18
  Administered 2021-08-18: 18:00:00 via INTRA_ARTERIAL

## 2021-08-18 MED ORDER — HEPARIN (PORCINE) IN NS (PF) 1,000 UNIT/500 ML IV
1000 unit/500 mL | Freq: Once | INTRAVENOUS | Status: AC
Start: 2021-08-18 — End: 2021-08-18
  Administered 2021-08-18: 18:00:00

## 2021-08-18 MED ORDER — NITROGLYCERIN 0.5 MG/ 10 ML (50 MCG/ML COMPOUNDED INJECTION)
0.5 mg/ 10mL | INTRAVENOUS | Status: DC | PRN
Start: 2021-08-18 — End: 2021-08-18
  Administered 2021-08-18: 18:00:00 via INTRA_ARTERIAL

## 2021-08-18 MED ORDER — HEPARIN (PORCINE) 1,000 UNIT/ML IJ SOLN
1000 unit/mL | INTRAMUSCULAR | Status: DC | PRN
Start: 2021-08-18 — End: 2021-08-18
  Administered 2021-08-18: 18:00:00

## 2021-08-18 MED ORDER — MIDAZOLAM 1 MG/ML IJ SOLN
1 mg/mL | INTRAMUSCULAR | Status: DC | PRN
Start: 2021-08-18 — End: 2021-08-18
  Administered 2021-08-18 (×3): via INTRAVENOUS

## 2021-08-18 MED ORDER — LIDOCAINE HCL 1 % (10 MG/ML) IJ SOLN
10 mg/mL (1 %) | Freq: Once | INTRAMUSCULAR | Status: AC
Start: 2021-08-18 — End: 2021-08-18
  Administered 2021-08-18: 18:00:00 via INTRADERMAL

## 2021-08-18 MED FILL — HEPARIN (PORCINE) 1,000 UNIT/ML IJ SOLN: 1000 unit/mL | INTRAMUSCULAR | Qty: 10

## 2021-08-18 MED FILL — VERAPAMIL 2.5 MG/ML IV: 2.5 mg/mL | INTRAVENOUS | Qty: 2

## 2021-08-18 MED FILL — NITROGLYCERIN 0.5 MG/ 10 ML (50 MCG/ML COMPOUNDED INJECTION): 0.5 mg/ 10mL | INTRAVENOUS | Qty: 20

## 2021-08-18 MED FILL — ISOVUE-300  61 % INTRAVENOUS SOLUTION: 300 mg iodine /mL (61 %) | INTRAVENOUS | Qty: 200

## 2021-08-18 MED FILL — FENTANYL CITRATE (PF) 50 MCG/ML IJ SOLN: 50 mcg/mL | INTRAMUSCULAR | Qty: 2

## 2021-08-18 MED FILL — MIDAZOLAM 1 MG/ML IJ SOLN: 1 mg/mL | INTRAMUSCULAR | Qty: 2

## 2021-08-18 MED FILL — HEPARIN (PORCINE) IN NS (PF) 1,000 UNIT/500 ML IV: 1000 unit/500 mL | INTRAVENOUS | Qty: 500

## 2021-08-18 MED FILL — LIDOCAINE HCL 1 % (10 MG/ML) IJ SOLN: 10 mg/mL (1 %) | INTRAMUSCULAR | Qty: 20

## 2021-08-18 MED FILL — HEPARIN (PORCINE) IN NS (PF) 1,000 UNIT/500 ML IV: 1000 unit/500 mL | INTRAVENOUS | Qty: 1000

## 2021-08-18 NOTE — Procedures (Signed)
Procedures  by Emi Belfast, MD at 08/18/21 1320                Author: Emi Belfast, MD  Service: Cardiology  Author Type: Physician       Filed: 08/18/21 1322  Date of Service: 08/18/21 1320  Status: Signed          Editor: Emi Belfast, MD (Physician)            Procedure Orders        1. CARDIAC PROCEDURE [[id number]] ordered by Emi Belfast, MD at 08/18/21 1204                                   Cardiac Catheterization Procedure Note          Patient: [name]  Age: 61 y.o.  Sex: female          Date of Birth: 1961-05-21  Admit Date: 08/18/2021  PCP: Daine Gravel         MRN: [id number]   CSN: [id number]              Date of Procedure: 08/18/2021    Pre-procedure Diagnosis: Angina   Post-procedure Diagnosis: No significant CAD      Procedures performed      Left heart catheterization   Coronary angiography      Description of Procedure:   After obtaining informed consent, the patient was brought to the cardiac catheterization laboratory.  The right wrist was prepped and draped in sterile fashion.  After administration of 1% lidocaine  to the access site, a 21-gauge needle was used to access the right radial artery and a 6 French 16 cm slender glide sheath was introduced.  Left heart catheterization and selective coronary angiography was done using a 5 [place] FR 4 and a 5 [place] FL  3.0 catheters. Radial cocktail was given using heparin, verapamil and nitroglycerine. Conscious sedation was administered using Versed and fentanyl.  At the end of the procedure, a TR band was applied for hemostasis.  Patient tolerated procedure well  without complications.  Please see below for details of angiography and/or intervention.      Conscious sedation      Case Start: 1239      Case End:   1300      Sedating agents were administered under my supervision.  An independent trained observer was present during the procedure for monitoring of moderate sedation with hemodynamic, continuous telemetry and  oxygen assessment as per case log.      Surgeon:  Dr. Emi Belfast, MD      Assistant(s):  None   Anesthesia: Moderate Sedation    Estimated Blood Loss: Less than 10 mL    Specimens Removed: None      Complications: None    Implants:  None      Findings:       Left ventricular end-diastolic pressure was 7 mmHg with no gradient on pullback      Left main coronary artery is free of significant disease      Left anterior descending artery is smooth stenosis of proximal segment no more than 10%, no significant stenosis      Left circumflex coronary artery is nondominant vessel free of significant stenosis      Right coronary artery is a large-caliber dominant vessel free of significant stenosis  Conclusions      Recurrent episodes of exertional and nonexertional chest discomfort with radiation to left arm and jaw   Angiographically normal vessels      Recommendations      Stay abstain from smoking that she is stopped recently   PPI trial   30-day monitor to assess for arrhythmias due to palpitations and both exertional and nonexertional symptoms      Emi Belfast, MD   August 18, 2021   1:20 PM

## 2021-08-18 NOTE — Progress Notes (Signed)
TR band removed  No active bleeding or hematoma noted Discharge instructions reviewed with patient. Acknowledged understanding and written copy provided. Assisted to car via wheelchair. Left with husband alert and ambulatory with no further complaints.

## 2021-08-22 ENCOUNTER — Encounter

## 2021-09-01 ENCOUNTER — Inpatient Hospital Stay: Admit: 2021-09-01 | Payer: BLUE CROSS/BLUE SHIELD | Primary: Physician Assistant

## 2021-09-01 DIAGNOSIS — R002 Palpitations: Secondary | ICD-10-CM

## 2021-09-01 NOTE — Procedures (Signed)
[facility]  Event Recorder  NAME:  [place], [place]  DATE OF BIRTH: February 13, 1961  DATE: 09/01/2021  SEX:   F  REFERRING PHYSICIAN: Emi Belfast  MR#    [id number]  LOCATION: CAR  BILLING#  [id number]    cc: Marisue Ivan, Montez Hageman, MD, [facility],;   Emi Belfast MD      PROCEDURE:  30-day event monitor.     ORDERED BY:    Dr. Emi Belfast.     INDICATIONS:    Palpitation.     This is outpatient ambulatory ECG event monitor.     FINDINGS:    Technically adequate 30-day outpatient ambulatory ECG event monitor enrolled from 09/01/2021 through 10/01/2021.  Technically adequate triple channel patient-activated events were recorded as follows:  1.  09/01/2021 at 0952 -- no symptoms reported -- sinus rhythm, 77-94 beats per minute, no ectopy.  2.  02/21 2023 at 0109 -- "chest pain" -- normal sinus rhythm, 73-88 beats per minute, no ectopy.  3.  09/09/2021 at 1020 hours -- "flutter" -- NSR 79-99 beats per minute -- no ectopy.  4.  09/11/2021 at 0938 -- "flutter" -- sinus arrhythmia at 74-143 beats per minute -- no ectopy.  5.  09/14/2021 at 1956 -- "flutter" -- sinus tachycardia at 118-140 beats per minute -- no ectopy.   6.  09/19/2021 at 1658 -- "chest pain" -- sinus rhythm and sinus tachycardia at 84-101 beats per minute -- no ectopy.  7.  09/21/2021 at 2330 -- "flutter" -- sinus rhythm with uniform VPCs at 81-99 beats per minute.  8.  09/24/2021 at 1750 -- no symptom recorded -- sinus tachycardia with VPC, 101-115 beats per minute.  9.  09/26/2021 at 0743 -- "flutter" -- sinus rhythm and sinus tachycardia with unifocal VPCs, 91-100 beats per minute.  10.  09/28/2021 at 1150 -- "flutter" -- sinus arrhythmia with VPC, 94-109 beats per minute.         ___________________  Harland German, MD, [facility], [facility]   Dictated GB:EEFEOFH C. Dian Situ,, MD, [facility], FCCP  SB  D: 10/04/2021 12:50:13  T: 10/04/2021 13:17:54  [id number]

## 2021-09-25 ENCOUNTER — Other Ambulatory Visit: Payer: Self-pay | Admitting: Physician Assistant

## 2021-09-25 DIAGNOSIS — Z1231 Encounter for screening mammogram for malignant neoplasm of breast: Secondary | ICD-10-CM

## 2021-10-09 ENCOUNTER — Emergency Department: Payer: BC Managed Care – PPO

## 2021-10-09 ENCOUNTER — Emergency Department
Admission: EM | Admit: 2021-10-09 | Discharge: 2021-10-09 | Disposition: A | Discharge status: Home / Self Care | Payer: BC Managed Care – PPO | Attending: Emergency Medicine | Admitting: Emergency Medicine

## 2021-10-09 ENCOUNTER — Other Ambulatory Visit: Payer: Self-pay

## 2021-10-09 DIAGNOSIS — K5790 Diverticulosis of intestine, part unspecified, without perforation or abscess without bleeding: Secondary | ICD-10-CM

## 2021-10-09 DIAGNOSIS — K5900 Constipation, unspecified: Secondary | ICD-10-CM | POA: Diagnosis not present

## 2021-10-09 DIAGNOSIS — K8020 Calculus of gallbladder without cholecystitis without obstruction: Secondary | ICD-10-CM | POA: Insufficient documentation

## 2021-10-09 DIAGNOSIS — K5730 Diverticulosis of large intestine without perforation or abscess without bleeding: Secondary | ICD-10-CM | POA: Diagnosis not present

## 2021-10-09 DIAGNOSIS — R1084 Generalized abdominal pain: Secondary | ICD-10-CM

## 2021-10-09 DIAGNOSIS — R112 Nausea with vomiting, unspecified: Secondary | ICD-10-CM

## 2021-10-09 DIAGNOSIS — F17200 Nicotine dependence, unspecified, uncomplicated: Secondary | ICD-10-CM | POA: Diagnosis not present

## 2021-10-09 DIAGNOSIS — U071 COVID-19: Secondary | ICD-10-CM | POA: Diagnosis not present

## 2021-10-09 LAB — COMPREHENSIVE METABOLIC PANEL
ALT: 31 U/L (ref 0–44)
AST: 33 U/L (ref 15–41)
Albumin: 3.7 g/dL (ref 3.5–5.0)
Alkaline Phosphatase: 76 U/L (ref 38–126)
Anion gap: 10 (ref 5–15)
BUN: 17 mg/dL (ref 8–23)
CO2: 24 mmol/L (ref 22–32)
Calcium: 8.6 mg/dL — ABNORMAL LOW (ref 8.9–10.3)
Chloride: 105 mmol/L (ref 98–111)
Creatinine, Ser: 0.78 mg/dL (ref 0.44–1.00)
GFR, Estimated: >60 mL/min (ref >60)
Glucose, Bld: 100 mg/dL — ABNORMAL HIGH (ref 70–99)
Potassium: 4 mmol/L (ref 3.5–5.1)
Sodium: 139 mmol/L (ref 135–145)
Total Bilirubin: 0.6 mg/dL (ref 0.3–1.2)
Total Protein: 6.9 g/dL (ref 6.5–8.1)

## 2021-10-09 LAB — URINALYSIS, ROUTINE W REFLEX MICROSCOPIC
Bilirubin Urine: NEGATIVE
Glucose, UA: NEGATIVE mg/dL
Ketones, ur: NEGATIVE mg/dL
Leukocytes,Ua: NEGATIVE
Nitrite: NEGATIVE
Protein, ur: NEGATIVE mg/dL
Specific Gravity, Urine: 1.017 (ref 1.005–1.030)
pH: 5 (ref 5.0–8.0)

## 2021-10-09 LAB — CBC
HCT: 43.4 % (ref 36.0–46.0)
Hemoglobin: 13.9 g/dL (ref 12.0–15.0)
MCH: 31 pg (ref 26.0–34.0)
MCHC: 32 g/dL (ref 30.0–36.0)
MCV: 96.7 fL (ref 80.0–100.0)
Platelets: 144 10*3/uL — ABNORMAL LOW (ref 150–400)
RBC: 4.49 MIL/uL (ref 3.87–5.11)
RDW: 13 % (ref 11.5–15.5)
WBC: 7.5 10*3/uL (ref 4.0–10.5)
nRBC: 0 % (ref 0.0–0.2)

## 2021-10-09 LAB — RESP PANEL BY RT-PCR (FLU A&B, COVID) ARPGX2
Influenza A by PCR: NEGATIVE
Influenza B by PCR: NEGATIVE
SARS Coronavirus 2 by RT PCR: POSITIVE — AB

## 2021-10-09 LAB — LIPASE, BLOOD: Lipase: 44 U/L (ref 11–51)

## 2021-10-09 MED ORDER — ONDANSETRON 4 MG PO TBDP: 4.0000 mg | ORAL_TABLET | Freq: Three times a day (TID) | ORAL | 0 refills | Status: DC | PRN

## 2021-10-09 MED ORDER — DICYCLOMINE HCL 20 MG PO TABS: 20.0000 mg | ORAL_TABLET | Freq: Four times a day (QID) | ORAL | 0 refills | Status: DC

## 2021-10-09 MED ORDER — IOHEXOL 300 MG/ML  SOLN
100.0000 mL | Freq: Once | INTRAMUSCULAR | Status: AC | PRN
  Administered 2021-10-09: 100 mL via INTRAVENOUS

## 2021-10-09 MED ORDER — HYDROMORPHONE HCL 1 MG/ML IJ SOLN
0.5000 mg | Freq: Once | INTRAMUSCULAR | Status: AC
  Administered 2021-10-09: 0.5 mg via INTRAVENOUS
  Filled 2021-10-09: qty 1

## 2021-10-09 MED ORDER — ONDANSETRON HCL 4 MG/2ML IJ SOLN
4.0000 mg | Freq: Once | INTRAMUSCULAR | Status: AC
  Administered 2021-10-09: 4 mg via INTRAVENOUS
  Filled 2021-10-09: qty 2

## 2021-10-09 MED ORDER — MORPHINE SULFATE (PF) 4 MG/ML IV SOLN
4.0000 mg | Freq: Once | INTRAVENOUS | Status: AC
  Administered 2021-10-09: 4 mg via INTRAVENOUS
  Filled 2021-10-09: qty 1

## 2021-10-09 MED ORDER — SODIUM CHLORIDE 0.9 % IV BOLUS
1000.0000 mL | Freq: Once | INTRAVENOUS | Status: AC
  Administered 2021-10-09: 1000 mL via INTRAVENOUS

## 2021-10-09 MED ORDER — LACTULOSE 10 GM/15ML PO SOLN
20.0000 g | Freq: Every day | ORAL | 0 refills | Status: DC | PRN
Start: 2021-10-09 — End: 2021-11-09

## 2021-10-09 MED ORDER — KETOROLAC TROMETHAMINE 30 MG/ML IJ SOLN
15.0000 mg | Freq: Once | INTRAMUSCULAR | Status: AC
  Administered 2021-10-09: 15 mg via INTRAVENOUS
  Filled 2021-10-09: qty 1

## 2021-10-09 NOTE — Discharge Instructions (Signed)
1.  You may take Lactulose as needed for bowel movements. ?2.  Take medicines as needed for abdominal discomfort and nausea (Bentyl/Zofran #20). ?2.  Return to the ER for worsening symptoms, persistent vomiting, difficulty breathing or other concerns. ?

## 2021-10-09 NOTE — ED Notes (Signed)
Pt taken to Ct at this time. ? ?

## 2021-10-09 NOTE — ED Triage Notes (Signed)
Pt states is covid positive, pt states is having lower abd pain, lower back pain and vomiting. Pt states has been having abd pain for several weeks, but vomiting started 20 min pta.  ?

## 2021-10-09 NOTE — ED Provider Notes (Signed)
? ?[facility] ?Provider Note ? ? ? Event Date/Time  ? First MD Initiated Contact with Patient 10/09/21 0100   ?  (approximate) ? ? ?History  ? ?Abdominal Pain ? ? ?HPI ? ?Courtney Villarreal is a 61 y.o. female who presents to the ED from home with a chief complaint of abdominal pain, nausea and vomiting.  Patient reports upper abdominal pain for several weeks not evaluated by her doctor.  Denied experiencing generalized abdominal pain, nausea and vomiting.  History of diverticulosis.  Incidentally patient had a positive home COVID-19 test yesterday.  Endorses mild cough and congestion.  Denies fever, chills, chest pain, shortness of breath, dysuria or diarrhea. ?  ? ? ?Past Medical History  ? ?Past Medical History:  ?Diagnosis Date  ? Degenerative disc disease   ? L4-5, uses aleve  ? Degenerative disk disease   ? GERD (gastroesophageal reflux disease)   ? takes otc zantac prn  ? History of chest pain 2009  ? had stress test in [facility]-requested copy of records-no cardiac findings per pt  ? PONV (postoperative nausea and vomiting)   ? Shortness of breath   ? smoker  ? ? ? ?Active Problem List  ? ?Patient Active Problem List  ? Diagnosis Date Noted  ? Personal history of colonic polyps   ? Rectal polyp   ? Polyp of sigmoid colon   ? ? ? ?Past Surgical History  ? ?Past Surgical History:  ?Procedure Laterality Date  ? ABDOMINAL HYSTERECTOMY  04/10/2011  ? ANTERIOR AND POSTERIOR REPAIR  04/10/2011  ? Procedure: ANTERIOR (CYSTOCELE) AND POSTERIOR REPAIR (RECTOCELE);  Surgeon: Shon Millet II;  Location: [facility];  Service: Gynecology;  Laterality: N/A;  Sacrospinous Ligament Suspension  ? BREAST EXCISIONAL BIOPSY Right 2013  ? ADH  ? BREAST SURGERY  05/07/12  ? Rt partial mastectomy  ? COLONOSCOPY WITH PROPOFOL N/A 05/15/2019  ? Procedure: COLONOSCOPY WITH PROPOFOL;  Surgeon: Lucilla Lame, MD;  Location: [place];  Service: Endoscopy;  Laterality: N/A;  ? conization of cervix    ? age 74  ?  LAPAROSCOPIC ASSISTED VAGINAL HYSTERECTOMY  04/10/2011  ? Procedure: LAPAROSCOPIC ASSISTED VAGINAL HYSTERECTOMY;  Surgeon: Shon Millet II;  Location: [facility];  Service: Gynecology;  Laterality: N/A;  ? POLYPECTOMY  05/15/2019  ? Procedure: POLYPECTOMY;  Surgeon: Lucilla Lame, MD;  Location: [place];  Service: Endoscopy;;  ? SALPINGOOPHORECTOMY  04/10/2011  ? Procedure: SALPINGO OOPHERECTOMY;  Surgeon: Shon Millet II;  Location: [facility];  Service: Gynecology;  Laterality: Bilateral;  ? ? ? ?Home Medications  ? ?Prior to Admission medications   ?Medication Sig Start Date End Date Taking? Authorizing Provider  ?dicyclomine (BENTYL) 20 MG tablet Take 1 tablet (20 mg total) by mouth every 6 (six) hours. 10/09/21  Yes Paulette Blanch, MD  ?lactulose (CHRONULAC) 10 GM/15ML solution Take 30 mLs (20 g total) by mouth daily as needed for mild constipation. 10/09/21  Yes Paulette Blanch, MD  ?ondansetron (ZOFRAN-ODT) 4 MG disintegrating tablet Take 1 tablet (4 mg total) by mouth every 8 (eight) hours as needed for nausea or vomiting. 10/09/21  Yes Paulette Blanch, MD  ?albuterol (VENTOLIN HFA) 108 (90 Base) MCG/ACT inhaler Inhale 1-2 puffs into the lungs every 6 (six) hours as needed for wheezing or shortness of breath. 07/26/21   Jaynee Eagles, PA-C  ?amoxicillin-clavulanate (AUGMENTIN) 875-125 MG tablet Take 1 tablet by mouth 2 (two) times daily. 07/26/21   Jaynee Eagles, PA-C  ?  carbamide peroxide (DEBROX) 6.5 % OTIC solution Place 5 drops into the left ear 2 (two) times daily. 07/26/21   Jaynee Eagles, PA-C  ?levocetirizine (XYZAL) 5 MG tablet Take 1 tablet (5 mg total) by mouth every evening. 07/26/21   Jaynee Eagles, PA-C  ?pantoprazole (PROTONIX) 20 MG tablet Take 1 tablet (20 mg total) by mouth daily. 09/15/13   Milton Ferguson, MD  ? ? ? ?Allergies  ?Patient has no known allergies. ? ? ?Family History  ? ?Family History  ?Problem Relation Age of Onset  ? Breast cancer Paternal Aunt 68  ? ? ? ?Physical Exam  ?Triage Vital  Signs: ?ED Triage Vitals [10/09/21 0022]  ?Enc Vitals Group  ?   BP 138/90  ?   Pulse Rate 71  ?   Resp 16  ?   Temp 98.4 ?F (36.9 ?C)  ?   Temp Source Oral  ?   SpO2 100 %  ?   Weight 165 lb (74.8 kg)  ?   Height '5\' 4"'$  (1.626 m)  ?   Head Circumference   ?   Peak Flow   ?   Pain Score 10  ?   Pain Loc   ?   Pain Edu?   ?   Excl. in [place]?   ? ? ?Updated Vital Signs: ?BP 113/67 (BP Location: Right Arm)   Pulse 65   Temp 98.4 ?F (36.9 ?C) (Oral)   Resp 16   Ht '5\' 4"'$  (1.626 m)   Wt 74.8 kg   SpO2 96%   BMI 28.32 kg/m?  ? ? ?General: Awake, mild distress.  ?CV:  RRR.  Good peripheral perfusion.  ?Resp:  Normal effort.  CTAB. ?Abd:  Mild diffuse tenderness to palpation without rebound or guarding.  No distention.  ?Other:  No vesicles ? ? ?ED Results / Procedures / Treatments  ?Labs ?(all labs ordered are listed, but only abnormal results are displayed) ?Labs Reviewed  ?RESP PANEL BY RT-PCR (FLU A&B, COVID) ARPGX2 - Abnormal; Notable for the following components:  ?    Result Value  ? SARS Coronavirus 2 by RT PCR POSITIVE (*)   ? All other components within normal limits  ?COMPREHENSIVE METABOLIC PANEL - Abnormal; Notable for the following components:  ? Glucose, Bld 100 (*)   ? Calcium 8.6 (*)   ? All other components within normal limits  ?CBC - Abnormal; Notable for the following components:  ? Platelets 144 (*)   ? All other components within normal limits  ?URINALYSIS, ROUTINE W REFLEX MICROSCOPIC - Abnormal; Notable for the following components:  ? Color, Urine YELLOW (*)   ? APPearance HAZY (*)   ? Hgb urine dipstick MODERATE (*)   ? Bacteria, UA RARE (*)   ? All other components within normal limits  ?LIPASE, BLOOD  ? ? ? ?EKG ? ?None ? ? ?RADIOLOGY ?I have independently visualized and reviewed patient's chest x-ray, CT abdomen pelvis as well as noted the radiology interpretation: ? ?Chest, bilateral shoulder x-rays: No acute cardiopulmonary process, unremarkable shoulder x-rays ? ?CT abdomen pelvis:  Cholelithiasis, moderate stool burden, diverticulosis ? ?Official radiology report(s): ?DG Chest 2 View ? ?Result Date: 10/09/2021 ?CLINICAL DATA:  COVID, cough, lower abdominal pain EXAM: CHEST - 2 VIEW COMPARISON:  07/26/2021 FINDINGS: Cardiac and mediastinal contours are within normal limits. No focal pulmonary opacity. No pleural effusion or pneumothorax. No acute osseous abnormality. IMPRESSION: No acute cardiopulmonary process. Electronically Signed   By: Francetta Found.D.  On: 10/09/2021 02:13  ? ?DG Shoulder Right ? ?Result Date: 10/09/2021 ?CLINICAL DATA:  Pain EXAM: RIGHT SHOULDER - 2+ VIEW COMPARISON:  None. FINDINGS: There is no evidence of fracture or dislocation. There is no evidence of arthropathy or other focal bone abnormality. Soft tissues are unremarkable. IMPRESSION: No acute osseous abnormality in the shoulder. Electronically Signed   By: Merilyn Baba M.D.   On: 10/09/2021 02:14  ? ?CT Abdomen Pelvis W Contrast ? ?Result Date: 10/09/2021 ?CLINICAL DATA:  Nausea and vomiting and abdominal pain. EXAM: CT ABDOMEN AND PELVIS WITH CONTRAST TECHNIQUE: Multidetector CT imaging of the abdomen and pelvis was performed using the standard protocol following bolus administration of intravenous contrast. RADIATION DOSE REDUCTION: This exam was performed according to the departmental dose-optimization program which includes automated exposure control, adjustment of the mA and/or kV according to patient size and/or use of iterative reconstruction technique. CONTRAST:  130m OMNIPAQUE IOHEXOL 300 MG/ML  SOLN COMPARISON:  None. FINDINGS: Lower chest: Minimal bibasilar dependent atelectasis. The visualized lung bases are otherwise clear. No intra-abdominal free air or free fluid. Hepatobiliary: Several subcentimeter scattered hepatic hypodense lesions are too small to characterize. No intrahepatic biliary dilatation. Small gallstone. No pericholecystic fluid or evidence of acute cholecystitis by CT. Pancreas:  Unremarkable. No pancreatic ductal dilatation or surrounding inflammatory changes. Spleen: Normal in size without focal abnormality. Adrenals/Urinary Tract: The left adrenal gland is unremarkable. There is a 1 cm indeterminate righ

## 2021-10-12 ENCOUNTER — Encounter: Payer: Self-pay | Admitting: Surgery

## 2021-10-12 ENCOUNTER — Ambulatory Visit: Payer: BC Managed Care – PPO | Admitting: Surgery

## 2021-10-12 VITALS — BP 122/80 | HR 60 | Temp 97.8°F | Ht 64.0 in | Wt 169.0 lb

## 2021-10-12 DIAGNOSIS — K5710 Diverticulosis of small intestine without perforation or abscess without bleeding: Secondary | ICD-10-CM

## 2021-10-12 DIAGNOSIS — K8010 Calculus of gallbladder with chronic cholecystitis without obstruction: Secondary | ICD-10-CM | POA: Diagnosis not present

## 2021-10-12 DIAGNOSIS — R1011 Right upper quadrant pain: Secondary | ICD-10-CM

## 2021-10-12 NOTE — Progress Notes (Signed)
Patient ID: Courtney Villarreal, female   DOB: 24-Aug-1960, 61 y.o.   MRN: [id number] ? ?Chief Complaint: Gallbladder ? ?History of Present Illness ?Courtney Villarreal is a 61 y.o. female with with progressive history of right upper quadrant/epigastric pain, culminating in a significant attack 2 days ago.  Typically associated with nausea and vomiting.  She denies fevers and chills.  She reports bloating.  She has loose stools.  She has intolerance of fatty foods.  She had a recent positive home test and ED test for COVID when she presented with her last episode of severe pain.  She denies any history of jaundice or hepatitis. ? ?Past Medical History ?Past Medical History:  ?Diagnosis Date  ? Degenerative disc disease   ? L4-5, uses aleve  ? Degenerative disk disease   ? GERD (gastroesophageal reflux disease)   ? takes otc zantac prn  ? History of chest pain 2009  ? had stress test in [facility]-requested copy of records-no cardiac findings per pt  ? PONV (postoperative nausea and vomiting)   ? Shortness of breath   ? smoker  ?  ? ? ?Past Surgical History:  ?Procedure Laterality Date  ? ABDOMINAL HYSTERECTOMY  04/10/2011  ? ANTERIOR AND POSTERIOR REPAIR  04/10/2011  ? Procedure: ANTERIOR (CYSTOCELE) AND POSTERIOR REPAIR (RECTOCELE);  Surgeon: Shon Millet II;  Location: [facility] [facility];  Service: Gynecology;  Laterality: N/A;  Sacrospinous Ligament Suspension  ? BREAST EXCISIONAL BIOPSY Right 2013  ? ADH  ? BREAST SURGERY  05/07/12  ? Rt partial mastectomy  ? COLONOSCOPY WITH PROPOFOL N/A 05/15/2019  ? Procedure: COLONOSCOPY WITH PROPOFOL;  Surgeon: Lucilla Lame, MD;  Location: [place];  Service: Endoscopy;  Laterality: N/A;  ? conization of cervix    ? age 56  ? LAPAROSCOPIC ASSISTED VAGINAL HYSTERECTOMY  04/10/2011  ? Procedure: LAPAROSCOPIC ASSISTED VAGINAL HYSTERECTOMY;  Surgeon: Shon Millet II;  Location: [facility];  Service: Gynecology;  Laterality: N/A;  ? POLYPECTOMY  05/15/2019  ? Procedure: POLYPECTOMY;  Surgeon: Lucilla Lame, MD;  Location: [place];  Service: Endoscopy;;  ? SALPINGOOPHORECTOMY  04/10/2011  ? Procedure: SALPINGO OOPHERECTOMY;  Surgeon: Shon Millet II;  Location: [facility];  Service: Gynecology;  Laterality: Bilateral;  ? ? ?No Known Allergies ? ?Current Outpatient Medications  ?Medication Sig Dispense Refill  ? albuterol (VENTOLIN HFA) 108 (90 Base) MCG/ACT inhaler Inhale 1-2 puffs into the lungs every 6 (six) hours as needed for wheezing or shortness of breath. 18 g 0  ? amoxicillin-clavulanate (AUGMENTIN) 875-125 MG tablet Take 1 tablet by mouth 2 (two) times daily. 20 tablet 0  ? carbamide peroxide (DEBROX) 6.5 % OTIC solution Place 5 drops into the left ear 2 (two) times daily. 15 mL 0  ? dicyclomine (BENTYL) 20 MG tablet Take 1 tablet (20 mg total) by mouth every 6 (six) hours. 20 tablet 0  ? lactulose (CHRONULAC) 10 GM/15ML solution Take 30 mLs (20 g total) by mouth daily as needed for mild constipation. 120 mL 0  ? levocetirizine (XYZAL) 5 MG tablet Take 1 tablet (5 mg total) by mouth every evening. 90 tablet 0  ? ondansetron (ZOFRAN-ODT) 4 MG disintegrating tablet Take 1 tablet (4 mg total) by mouth every 8 (eight) hours as needed for nausea or vomiting. 20 tablet 0  ? pantoprazole (PROTONIX) 20 MG tablet Take 1 tablet (20 mg total) by mouth daily. 20 tablet 0  ? ?No current facility-administered medications for this visit.  ? ? ?Family  History ?Family History  ?Problem Relation Age of Onset  ? Breast cancer Paternal Aunt 22  ?  ? ? ?Social History ?Social History  ? ?Tobacco Use  ? Smoking status: Every Day  ?  Packs/day: 1.00  ?  Years: 41.00  ?  Pack years: 41.00  ?  Types: Cigarettes  ? Smokeless tobacco: Never  ? Tobacco comments:  ?  since age 5 (05/11/19 - currently 2-3 cigs/day)  ?Vaping Use  ? Vaping Use: Every day  ? Substances: Nicotine, Flavoring  ?Substance Use Topics  ? Alcohol use: Yes  ?  Comment: rarely  ? Drug use: No  ?  ?  ? ? ?Review of Systems  ?Constitutional:  Positive  for malaise/fatigue.  ?Respiratory:  Positive for shortness of breath and wheezing.   ?Cardiovascular:  Positive for chest pain, orthopnea and claudication.  ?Gastrointestinal:  Positive for abdominal pain, constipation, heartburn, nausea and vomiting.  ?Genitourinary:  Positive for dysuria.  ?Psychiatric/Behavioral:  Positive for depression.   ?All other systems reviewed and are negative. ?  ? ?Physical Exam ?There were no vitals taken for this visit. ? ? ?CONSTITUTIONAL: Well developed, and nourished, appropriately responsive and aware without distress.   ?EYES: Sclera non-icteric.   ?EARS, NOSE, MOUTH AND THROAT:  The oropharynx is clear. Oral mucosa is pink and moist.    Hearing is intact to voice.  ?NECK: Trachea is midline, and there is no jugular venous distension.  ?LYMPH NODES:  Lymph nodes in the neck are not enlarged. ?RESPIRATORY:  Lungs are clear, and breath sounds are equal bilaterally. Normal respiratory effort without pathologic use of accessory muscles. ?CARDIOVASCULAR: Heart is regular in rate and rhythm. ?GI: The abdomen is soft, nontender, and nondistended. There were no palpable masses. I did not appreciate hepatosplenomegaly. There were normal bowel sounds. ?MUSCULOSKELETAL:  Symmetrical muscle tone appreciated in all four extremities.    ?SKIN: Skin turgor is normal. No pathologic skin lesions appreciated.  ?NEUROLOGIC:  Motor and sensation appear grossly normal.  Cranial nerves are grossly without defect. ?PSYCH:  Alert and oriented to person, place and time. Affect is appropriate for situation. ? ?Data Reviewed ?I have personally reviewed what is currently available of the patient's imaging, recent labs and medical records.   ?Labs:  ? ?  Latest Ref Rng & Units 10/09/2021  ? 12:28 AM 08/14/2020  ?  9:38 AM 01/22/2017  ?  2:11 PM  ?CBC  ?WBC 4.0 - 10.5 K/uL 7.5   8.3   7.9    ?Hemoglobin 12.0 - 15.0 g/dL 13.9   13.7   14.0    ?Hematocrit 36.0 - 46.0 % 43.4   40.9   40.9    ?Platelets 150 - 400  K/uL 144   180   190    ? ? ?  Latest Ref Rng & Units 10/09/2021  ? 12:28 AM 08/14/2020  ?  9:38 AM 01/22/2017  ?  2:11 PM  ?CMP  ?Glucose 70 - 99 mg/dL 100   93   112    ?BUN 8 - 23 mg/dL '17   14   13    '$ ?Creatinine 0.44 - 1.00 mg/dL 0.78   0.65   0.67    ?Sodium 135 - 145 mmol/L 139   140   138    ?Potassium 3.5 - 5.1 mmol/L 4.0   3.9   3.5    ?Chloride 98 - 111 mmol/L 105   104   105    ?CO2 22 -  32 mmol/L '24   26   25    '$ ?Calcium 8.9 - 10.3 mg/dL 8.6   9.0   9.2    ?Total Protein 6.5 - 8.1 g/dL 6.9   6.5     ?Total Bilirubin 0.3 - 1.2 mg/dL 0.6   0.7     ?Alkaline Phos 38 - 126 U/L 76   56     ?AST 15 - 41 U/L 33   18     ?ALT 0 - 44 U/L 31   16     ? ? ? ? ?Imaging: ?Radiology review:  ?CLINICAL DATA:  Nausea and vomiting and abdominal pain. ?  ?EXAM: ?CT ABDOMEN AND PELVIS WITH CONTRAST ?  ?TECHNIQUE: ?Multidetector CT imaging of the abdomen and pelvis was performed ?using the standard protocol following bolus administration of ?intravenous contrast. ?  ?RADIATION DOSE REDUCTION: This exam was performed according to the ?departmental dose-optimization program which includes automated ?exposure control, adjustment of the mA and/or kV according to ?patient size and/or use of iterative reconstruction technique. ?  ?CONTRAST:  138m OMNIPAQUE IOHEXOL 300 MG/ML  SOLN ?  ?COMPARISON:  None. ?  ?FINDINGS: ?Lower chest: Minimal bibasilar dependent atelectasis. The visualized ?lung bases are otherwise clear. ?  ?No intra-abdominal free air or free fluid. ?  ?Hepatobiliary: Several subcentimeter scattered hepatic hypodense ?lesions are too small to characterize. No intrahepatic biliary ?dilatation. Small gallstone. No pericholecystic fluid or evidence of ?acute cholecystitis by CT. ?  ?Pancreas: Unremarkable. No pancreatic ductal dilatation or ?surrounding inflammatory changes. ?  ?Spleen: Normal in size without focal abnormality. ?  ?Adrenals/Urinary Tract: The left adrenal gland is unremarkable. ?There is a 1 cm  indeterminate right adrenal nodule. There is no ?hydronephrosis on either side. There is symmetric enhancement and ?excretion of contrast by both kidneys. The visualized ureters and ?urinary bladder appear unrem

## 2021-10-12 NOTE — Patient Instructions (Addendum)
Your Ultrasound is scheduled for 10/23/2021 10/20/2021 @ 8:30 am (arrive by 8 am) at [facility]. Nothing to eat or drink after midnight. ? ? ?Advised to pursue a goal of 25 to 30 g of fiber daily.  Made aware that the majority of this may be through natural sources, but advised to be aware of actual consumption and to ensure minimal consumption by daily supplementation.  Various forms of supplements discussed.  Recommended Psyllium husk, that mixes well with applesauce, or the powder which goes down well shaken with chocolate milk.  ?Strongly advised to consume more fluids to ensure adequate hydration, instructed to watch color of urine to determine adequacy of hydration.  Clarity is pursued in urine output, and bowel activity that correlates to significant meal intake.   ?We need to avoid deferring having bowel movements, advised to take the time at the first sign of sensation, typically following meals, and in the morning.   ?Subsequent utilization of MiraLAX may be needed ensure at least daily movement, ideally twice daily bowel movements.  If multiple doses of MiraLAX are necessary utilize them. ?Never skip a day...  ?To be regular, we must do the above EVERY day.  ? ? ? ?Our surgery scheduler Pamala Hurry will call you within 24-48 hours to get you scheduled. If you have not heard from her after 48 hours, please call our office. Have the blue sheet available when she calls to write down important information. ? ? ?If you have any concerns or questions, please feel free to call our office.  ? ?Minimally Invasive Cholecystectomy ?Minimally invasive cholecystectomy is surgery to remove the gallbladder. The gallbladder is a pear-shaped organ that lies beneath the liver on the right side of the body. The gallbladder stores bile, which is a fluid that helps the body digest fats. Cholecystectomy is often done to treat inflammation (irritation and swelling) of the gallbladder (cholecystitis). This condition is  usually caused by a buildup of gallstones (cholelithiasis) in the gallbladder or when the fluid in the gall bladder becomes stagnant because gallstones get stuck in the ducts (tubes) and block the flow of bile. This can result in inflammation and pain. In severe cases, emergency surgery may be required. ?This procedure is done through small incisions in the abdomen, instead of one large incision. It is also called laparoscopic surgery. A thin scope with a camera (laparoscope) is inserted through one incision. Then surgical instruments are inserted through the other incisions. In some cases, a minimally invasive surgery may need to be changed to a surgery that is done through a larger incision. This is called open surgery. ?Tell a health care provider about: ?Any allergies you have. ?All medicines you are taking, including vitamins, herbs, eye drops, creams, and over-the-counter medicines. ?Any problems you or family members have had with anesthetic medicines. ?Any bleeding problems you have. ?Any surgeries you have had. ?Any medical conditions you have. ?Whether you are pregnant or may be pregnant. ?What are the risks? ?Generally, this is a safe procedure. However, problems may occur, including: ?Infection. ?Bleeding. ?Allergic reactions to medicines. ?Damage to nearby structures or organs. ?A gallstone remaining in the common bile duct. The common bile duct carries bile from the gallbladder to the small intestine. ?A bile leak from the liver or cystic duct after your gallbladder is removed. ?What happens before the procedure? ?When to stop eating and drinking ?Follow instructions from your health care provider about what you may eat and drink before your procedure. These may include: ?  8 hours before the procedure ?Stop eating most foods. Do not eat meat, fried foods, or fatty foods. ?Eat only light foods, such as toast or crackers. ?All liquids are okay except energy drinks and alcohol. ?6 hours before the  procedure ?Stop eating. ?Drink only clear liquids, such as water, clear fruit juice, black coffee, plain tea, and sports drinks. ?Do not drink energy drinks or alcohol. ?2 hours before the procedure ?Stop drinking all liquids. ?You may be allowed to take medicines with small sips of water. ?If you do not follow your health care provider's instructions, your procedure may be delayed or canceled. ?Medicines ?Ask your health care provider about: ?Changing or stopping your regular medicines. This is especially important if you are taking diabetes medicines or blood thinners. ?Taking medicines such as aspirin and ibuprofen. These medicines can thin your blood. Do not take these medicines unless your health care provider tells you to take them. ?Taking over-the-counter medicines, vitamins, herbs, and supplements. ?General instructions ?If you will be going home right after the procedure, plan to have a responsible adult: ?Take you home from the hospital or clinic. You will not be allowed to drive. ?Care for you for the time you are told. ?Do not use any products that contain nicotine or tobacco for at least 4 weeks before the procedure. These products include cigarettes, chewing tobacco, and vaping devices, such as e-cigarettes. If you need help quitting, ask your health care provider. ?Ask your health care provider: ?How your surgery site will be marked. ?What steps will be taken to help prevent infection. These may include: ?Removing hair at the surgery site. ?Washing skin with a germ-killing soap. ?Taking antibiotic medicine. ?What happens during the procedure? ? ?An IV will be inserted into one of your veins. ?You will be given one or both of the following: ?A medicine to help you relax (sedative). ?A medicine to make you fall asleep (general anesthetic). ?Your surgeon will make several small incisions in your abdomen. ?The laparoscope will be inserted through one of the small incisions. The camera on the laparoscope  will send images to a monitor in the operating room. This lets your surgeon see inside your abdomen. ?A gas will be pumped into your abdomen. This will expand your abdomen to give the surgeon more room to perform the surgery. ?Other tools that are needed for the procedure will be inserted through the other incisions. The gallbladder will be removed through one of the incisions. ?Your common bile duct may be examined. If stones are found in the common bile duct, they may be removed. ?After your gallbladder has been removed, the incisions will be closed with stitches (sutures), staples, or skin glue. ?Your incisions will be covered with a bandage (dressing). ?The procedure may vary among health care providers and hospitals. ?What happens after the procedure? ?Your blood pressure, heart rate, breathing rate, and blood oxygen level will be monitored until you leave the hospital or clinic. ?You will be given medicines as needed to control your pain. ?You may have a drain placed in the incision. The drain will be removed a day or two after the procedure. ?Summary ?Minimally invasive cholecystectomy, also called laparoscopic cholecystectomy, is surgery to remove the gallbladder using small incisions. ?Tell your health care provider about all the medical conditions you have and all the medicines you are taking for those conditions. ?Before the procedure, follow instructions about when to stop eating and drinking and changing or stopping medicines. ?Plan to have a responsible adult  care for you for the time you are told after you leave the hospital or clinic. ?This information is not intended to replace advice given to you by your health care provider. Make sure you discuss any questions you have with your health care provider. ?Document Revised: 01/03/2021 Document Reviewed: 01/03/2021 ?Elsevier Patient Education ? 2022 Kim. ? ? ?Gallbladder Eating Plan ?If you have a gallbladder condition, you may have trouble  digesting fats. Eating a low-fat diet can help reduce your symptoms, and may be helpful before and after having surgery to remove your gallbladder (cholecystectomy). Your health care provider may recommend that you wo

## 2021-10-12 NOTE — H&P (View-Only) (Signed)
Patient ID: Courtney Villarreal, female   DOB: [birth year], 61 y.o.   MRN: [id number] ? ?Chief Complaint: Gallbladder ? ?History of Present Illness ?Courtney Villarreal is a 61 y.o. female with with progressive history of right upper quadrant/epigastric pain, culminating in a significant attack 2 days ago.  Typically associated with nausea and vomiting.  She denies fevers and chills.  She reports bloating.  She has loose stools.  She has intolerance of fatty foods.  She had a recent positive home test and ED test for COVID when she presented with her last episode of severe pain.  She denies any history of jaundice or hepatitis. ? ?Past Medical History ?Past Medical History:  ?Diagnosis Date  ? Degenerative disc disease   ? L4-5, uses aleve  ? Degenerative disk disease   ? GERD (gastroesophageal reflux disease)   ? takes otc zantac prn  ? History of chest pain 2009  ? had stress test in [facility]-requested copy of records-no cardiac findings per pt  ? PONV (postoperative nausea and vomiting)   ? Shortness of breath   ? smoker  ?  ? ? ?Past Surgical History:  ?Procedure Laterality Date  ? ABDOMINAL HYSTERECTOMY  04/10/2011  ? ANTERIOR AND POSTERIOR REPAIR  04/10/2011  ? Procedure: ANTERIOR (CYSTOCELE) AND POSTERIOR REPAIR (RECTOCELE);  Surgeon: Shon Millet II;  Location: [facility];  Service: Gynecology;  Laterality: N/A;  Sacrospinous Ligament Suspension  ? BREAST EXCISIONAL BIOPSY Right 2013  ? ADH  ? BREAST SURGERY  05/07/12  ? Rt partial mastectomy  ? COLONOSCOPY WITH PROPOFOL N/A 05/15/2019  ? Procedure: COLONOSCOPY WITH PROPOFOL;  Surgeon: Lucilla Lame, MD;  Location: [facility];  Service: Endoscopy;  Laterality: N/A;  ? conization of cervix    ? age 89  ? LAPAROSCOPIC ASSISTED VAGINAL HYSTERECTOMY  04/10/2011  ? Procedure: LAPAROSCOPIC ASSISTED VAGINAL HYSTERECTOMY;  Surgeon: Shon Millet II;  Location: [facility];  Service: Gynecology;  Laterality: N/A;  ? POLYPECTOMY  05/15/2019  ? Procedure: POLYPECTOMY;  Surgeon: Lucilla Lame, MD;  Location: [facility];  Service: Endoscopy;;  ? SALPINGOOPHORECTOMY  04/10/2011  ? Procedure: SALPINGO OOPHERECTOMY;  Surgeon: Shon Millet II;  Location: [facility];  Service: Gynecology;  Laterality: Bilateral;  ? ? ?No Known Allergies ? ?Current Outpatient Medications  ?Medication Sig Dispense Refill  ? albuterol (VENTOLIN HFA) 108 (90 Base) MCG/ACT inhaler Inhale 1-2 puffs into the lungs every 6 (six) hours as needed for wheezing or shortness of breath. 18 g 0  ? amoxicillin-clavulanate (AUGMENTIN) 875-125 MG tablet Take 1 tablet by mouth 2 (two) times daily. 20 tablet 0  ? carbamide peroxide (DEBROX) 6.5 % OTIC solution Place 5 drops into the left ear 2 (two) times daily. 15 mL 0  ? dicyclomine (BENTYL) 20 MG tablet Take 1 tablet (20 mg total) by mouth every 6 (six) hours. 20 tablet 0  ? lactulose (CHRONULAC) 10 GM/15ML solution Take 30 mLs (20 g total) by mouth daily as needed for mild constipation. 120 mL 0  ? levocetirizine (XYZAL) 5 MG tablet Take 1 tablet (5 mg total) by mouth every evening. 90 tablet 0  ? ondansetron (ZOFRAN-ODT) 4 MG disintegrating tablet Take 1 tablet (4 mg total) by mouth every 8 (eight) hours as needed for nausea or vomiting. 20 tablet 0  ? pantoprazole (PROTONIX) 20 MG tablet Take 1 tablet (20 mg total) by mouth daily. 20 tablet 0  ? ?No current facility-administered medications for this visit.  ? ? ?Family  History ?Family History  ?Problem Relation Age of Onset  ? Breast cancer Paternal Aunt 34  ?  ? ? ?Social History ?Social History  ? ?Tobacco Use  ? Smoking status: Every Day  ?  Packs/day: 1.00  ?  Years: 41.00  ?  Pack years: 41.00  ?  Types: Cigarettes  ? Smokeless tobacco: Never  ? Tobacco comments:  ?  since age 26 (05/11/19 - currently 2-3 cigs/day)  ?Vaping Use  ? Vaping Use: Every day  ? Substances: Nicotine, Flavoring  ?Substance Use Topics  ? Alcohol use: Yes  ?  Comment: rarely  ? Drug use: No  ?  ?  ? ? ?Review of Systems  ?Constitutional:  Positive  for malaise/fatigue.  ?Respiratory:  Positive for shortness of breath and wheezing.   ?Cardiovascular:  Positive for chest pain, orthopnea and claudication.  ?Gastrointestinal:  Positive for abdominal pain, constipation, heartburn, nausea and vomiting.  ?Genitourinary:  Positive for dysuria.  ?Psychiatric/Behavioral:  Positive for depression.   ?All other systems reviewed and are negative. ?  ? ?Physical Exam ?There were no vitals taken for this visit. ? ? ?CONSTITUTIONAL: Well developed, and nourished, appropriately responsive and aware without distress.   ?EYES: Sclera non-icteric.   ?EARS, NOSE, MOUTH AND THROAT:  The oropharynx is clear. Oral mucosa is pink and moist.    Hearing is intact to voice.  ?NECK: Trachea is midline, and there is no jugular venous distension.  ?LYMPH NODES:  Lymph nodes in the neck are not enlarged. ?RESPIRATORY:  Lungs are clear, and breath sounds are equal bilaterally. Normal respiratory effort without pathologic use of accessory muscles. ?CARDIOVASCULAR: Heart is regular in rate and rhythm. ?GI: The abdomen is soft, nontender, and nondistended. There were no palpable masses. I did not appreciate hepatosplenomegaly. There were normal bowel sounds. ?MUSCULOSKELETAL:  Symmetrical muscle tone appreciated in all four extremities.    ?SKIN: Skin turgor is normal. No pathologic skin lesions appreciated.  ?NEUROLOGIC:  Motor and sensation appear grossly normal.  Cranial nerves are grossly without defect. ?PSYCH:  Alert and oriented to person, place and time. Affect is appropriate for situation. ? ?Data Reviewed ?I have personally reviewed what is currently available of the patient's imaging, recent labs and medical records.   ?Labs:  ? ?  Latest Ref Rng & Units 10/09/2021  ? 12:28 AM 08/14/2020  ?  9:38 AM 01/22/2017  ?  2:11 PM  ?CBC  ?WBC 4.0 - 10.5 K/uL 7.5   8.3   7.9    ?Hemoglobin 12.0 - 15.0 g/dL 13.9   13.7   14.0    ?Hematocrit 36.0 - 46.0 % 43.4   40.9   40.9    ?Platelets 150 - 400  K/uL 144   180   190    ? ? ?  Latest Ref Rng & Units 10/09/2021  ? 12:28 AM 08/14/2020  ?  9:38 AM 01/22/2017  ?  2:11 PM  ?CMP  ?Glucose 70 - 99 mg/dL 100   93   112    ?BUN 8 - 23 mg/dL '17   14   13    '$ ?Creatinine 0.44 - 1.00 mg/dL 0.78   0.65   0.67    ?Sodium 135 - 145 mmol/L 139   140   138    ?Potassium 3.5 - 5.1 mmol/L 4.0   3.9   3.5    ?Chloride 98 - 111 mmol/L 105   104   105    ?CO2 22 -  32 mmol/L '24   26   25    '$ ?Calcium 8.9 - 10.3 mg/dL 8.6   9.0   9.2    ?Total Protein 6.5 - 8.1 g/dL 6.9   6.5     ?Total Bilirubin 0.3 - 1.2 mg/dL 0.6   0.7     ?Alkaline Phos 38 - 126 U/L 76   56     ?AST 15 - 41 U/L 33   18     ?ALT 0 - 44 U/L 31   16     ? ? ? ? ?Imaging: ?Radiology review:  ?CLINICAL DATA:  Nausea and vomiting and abdominal pain. ?  ?EXAM: ?CT ABDOMEN AND PELVIS WITH CONTRAST ?  ?TECHNIQUE: ?Multidetector CT imaging of the abdomen and pelvis was performed ?using the standard protocol following bolus administration of ?intravenous contrast. ?  ?RADIATION DOSE REDUCTION: This exam was performed according to the ?departmental dose-optimization program which includes automated ?exposure control, adjustment of the mA and/or kV according to ?patient size and/or use of iterative reconstruction technique. ?  ?CONTRAST:  182m OMNIPAQUE IOHEXOL 300 MG/ML  SOLN ?  ?COMPARISON:  None. ?  ?FINDINGS: ?Lower chest: Minimal bibasilar dependent atelectasis. The visualized ?lung bases are otherwise clear. ?  ?No intra-abdominal free air or free fluid. ?  ?Hepatobiliary: Several subcentimeter scattered hepatic hypodense ?lesions are too small to characterize. No intrahepatic biliary ?dilatation. Small gallstone. No pericholecystic fluid or evidence of ?acute cholecystitis by CT. ?  ?Pancreas: Unremarkable. No pancreatic ductal dilatation or ?surrounding inflammatory changes. ?  ?Spleen: Normal in size without focal abnormality. ?  ?Adrenals/Urinary Tract: The left adrenal gland is unremarkable. ?There is a 1 cm  indeterminate right adrenal nodule. There is no ?hydronephrosis on either side. There is symmetric enhancement and ?excretion of contrast by both kidneys. The visualized ureters and ?urinary bladder appear unrem

## 2021-10-13 DIAGNOSIS — K8010 Calculus of gallbladder with chronic cholecystitis without obstruction: Secondary | ICD-10-CM | POA: Insufficient documentation

## 2021-10-13 DIAGNOSIS — K5710 Diverticulosis of small intestine without perforation or abscess without bleeding: Secondary | ICD-10-CM | POA: Insufficient documentation

## 2021-10-16 ENCOUNTER — Telehealth: Payer: Self-pay | Admitting: Surgery

## 2021-10-16 ENCOUNTER — Ambulatory Visit: Payer: Self-pay | Admitting: Surgery

## 2021-10-16 DIAGNOSIS — K8010 Calculus of gallbladder with chronic cholecystitis without obstruction: Secondary | ICD-10-CM

## 2021-10-16 NOTE — Telephone Encounter (Signed)
Confirmed surgery date with patient.  ?

## 2021-10-16 NOTE — Telephone Encounter (Signed)
Outgoing call is made, left message for patient to call, please inform of the following regarding scheduled surgery:  ? ?Pre-Admission date/time, COVID Testing date and Surgery date. ? ?Surgery Date: 10/25/21 ?Preadmission Testing Date: 10/18/21 (phone 8a-1p) ?Covid Testing Date: Not needed.   ? ?Also patient will need to call at [phone], between 1-3:00pm the day before surgery, to find out what time to arrive for surgery.   ? ?

## 2021-10-18 ENCOUNTER — Other Ambulatory Visit: Payer: Self-pay

## 2021-10-18 ENCOUNTER — Encounter
Admission: RE | Admit: 2021-10-18 | Discharge: 2021-10-18 | Disposition: A | Discharge status: Home / Self Care | Payer: BC Managed Care – PPO | Source: Ambulatory Visit | Attending: Surgery | Admitting: Surgery

## 2021-10-18 HISTORY — DX: Malignant (primary) neoplasm, unspecified: C80.1

## 2021-10-18 NOTE — Patient Instructions (Addendum)
Your procedure is scheduled on: Wednesday 10/25/21 ?Report to the Registration Desk on the 1st floor of the [facility]. ?To find out your arrival time, please call [phone] between 1PM - 3PM on: Tuesday 10/24/21 ? ?REMEMBER: ?Instructions that are not followed completely may result in serious medical risk, up to and including death; or upon the discretion of your surgeon and anesthesiologist your surgery may need to be rescheduled. ? ?Do not eat or drink after midnight the night before surgery.  ?No gum chewing, lozengers or hard candies. ? ?TAKE THESE MEDICATIONS THE MORNING OF SURGERY WITH A SIP OF WATER: ?pantoprazole (PROTONIX) 40 MG tablet (take one the night before and one on the morning of surgery - helps to prevent nausea after surgery.) ? ?Use your albuterol (VENTOLIN HFA) 108 (90 Base) MCG/ACT inhaler on the day of surgery and bring to the hospital. ? ?One week prior to surgery: ?Stop Anti-inflammatories (NSAIDS) such as Advil, Aleve, Ibuprofen, Motrin, Naproxen, Naprosyn and Aspirin based products such as Excedrin, Goodys Powder, BC Powder. ? ?Stop taking your Ascorbic Acid (VITAMIN C PO, Multiple Vitamins-Minerals (ZINC PO) and ANY other OVER THE COUNTER supplements until after surgery. ? ?You may however, continue to take Tylenol if needed for pain up until the day of surgery. ? ?No Alcohol for 24 hours before or after surgery. ? ?No Smoking including e-cigarettes for 24 hours prior to surgery.  ?No chewable tobacco products for at least 6 hours prior to surgery.  ?No nicotine patches on the day of surgery. ? ?Do not use any "recreational" drugs for at least a week prior to your surgery.  ?Please be advised that the combination of cocaine and anesthesia may have negative outcomes, up to and including death. ?If you test positive for cocaine, your surgery will be cancelled. ? ?On the morning of surgery brush your teeth with toothpaste and water, you may rinse your mouth with mouthwash if you  wish. ?Do not swallow any toothpaste or mouthwash. ? ?Use Dial Soap at home the night prior to surgery and then put on clean pajamas and a clean sheet on your bed. Repeat the Dial soap regimen the morning of surgery also. ? ?Do not wear jewelry, make-up, hairpins, clips or nail polish. ? ?Do not wear lotions, powders, or perfumes.  ? ?Do not shave body from the neck down 48 hours prior to surgery just in case you cut yourself which could leave a site for infection.  ?Also, freshly shaved skin may become irritated if using the CHG soap. ? ?Do not bring valuables to the hospital. [facility] is not responsible for any missing/lost belongings or valuables.  ? ?Notify your doctor if there is any change in your medical condition (cold, fever, infection). ? ?Wear comfortable clothing (specific to your surgery type) to the hospital. ? ?When coughing or sneezing, hold a pillow firmly against your incision with both hands. This is called ?splinting.? Doing this helps protect your incision. It also decreases belly discomfort. ? ?If you are being discharged the day of surgery, you will not be allowed to drive home. ?You will need a responsible adult (18 years or older) to drive you home and stay with you that night.  ? ?If you are taking public transportation, you will need to have a responsible adult (18 years or older) with you. ?Please confirm with your physician that it is acceptable to use public transportation.  ? ?Please call the [facility] at [phone] if you have any  questions about these instructions. ? ?Surgery Visitation Policy: ? ?Patients undergoing a surgery or procedure may have two family members or support persons with them as long as the person is not COVID-19 positive or experiencing its symptoms.  ? ?Inpatient Visitation:   ? ?Visiting hours are 7 a.m. to 8 p.m. ?Up to four visitors are allowed at one time in a patient room, including children. The visitors may rotate out with other  people during the day. One designated support person (adult) may remain overnight.  ?

## 2021-10-20 ENCOUNTER — Ambulatory Visit
Admission: RE | Admit: 2021-10-20 | Discharge: 2021-10-20 | Disposition: A | Discharge status: Home / Self Care | Payer: BC Managed Care – PPO | Source: Ambulatory Visit | Attending: Surgery | Admitting: Surgery

## 2021-10-20 DIAGNOSIS — R1011 Right upper quadrant pain: Secondary | ICD-10-CM | POA: Diagnosis present

## 2021-10-24 MED ORDER — BUPIVACAINE LIPOSOME 1.3 % IJ SUSP: 20.0000 mL | Freq: Once | INTRAMUSCULAR | Status: DC

## 2021-10-24 MED ORDER — CEFAZOLIN SODIUM-DEXTROSE 2-4 GM/100ML-% IV SOLN: 2.0000 g | INTRAVENOUS | Status: DC

## 2021-10-24 MED ORDER — CHLORHEXIDINE GLUCONATE CLOTH 2 % EX PADS
6.0000 | MEDICATED_PAD | Freq: Once | CUTANEOUS | Status: AC
  Administered 2021-10-25: 6 via TOPICAL

## 2021-10-24 MED ORDER — ACETAMINOPHEN 500 MG PO TABS: 1000.0000 mg | ORAL_TABLET | ORAL | Status: AC

## 2021-10-24 MED ORDER — CELECOXIB 200 MG PO CAPS: 200.0000 mg | ORAL_CAPSULE | ORAL | Status: AC

## 2021-10-24 MED ORDER — CHLORHEXIDINE GLUCONATE 0.12 % MT SOLN: 15.0000 mL | Freq: Once | OROMUCOSAL | Status: AC

## 2021-10-24 MED ORDER — GABAPENTIN 300 MG PO CAPS: 300.0000 mg | ORAL_CAPSULE | ORAL | Status: AC

## 2021-10-24 MED ORDER — APREPITANT 40 MG PO CAPS
40.0000 mg | ORAL_CAPSULE | Freq: Once | ORAL | Status: AC
Start: 2021-10-24 — End: 2021-10-25

## 2021-10-24 MED ORDER — ORAL CARE MOUTH RINSE: 15.0000 mL | Freq: Once | OROMUCOSAL | Status: AC

## 2021-10-24 MED ORDER — LACTATED RINGERS IV SOLN: INTRAVENOUS | Status: DC

## 2021-10-24 MED ORDER — INDOCYANINE GREEN 25 MG IV SOLR
1.2500 mg | Freq: Once | INTRAVENOUS | Status: AC
  Administered 2021-10-25: 1.25 mg via INTRAVENOUS
  Filled 2021-10-24: qty 0.5

## 2021-10-25 ENCOUNTER — Encounter
Admission: RE | Disposition: A | Discharge status: Home / Self Care | Payer: Self-pay | Source: Home / Self Care | Attending: Surgery

## 2021-10-25 ENCOUNTER — Ambulatory Visit: Payer: BC Managed Care – PPO | Admitting: Anesthesiology

## 2021-10-25 ENCOUNTER — Ambulatory Visit
Admission: RE | Admit: 2021-10-25 | Discharge: 2021-10-25 | Disposition: A | Discharge status: Home / Self Care | Payer: BC Managed Care – PPO | Attending: Surgery | Admitting: Surgery

## 2021-10-25 ENCOUNTER — Other Ambulatory Visit: Payer: Self-pay

## 2021-10-25 ENCOUNTER — Encounter: Payer: Self-pay | Admitting: Surgery

## 2021-10-25 DIAGNOSIS — K811 Chronic cholecystitis: Secondary | ICD-10-CM | POA: Diagnosis not present

## 2021-10-25 DIAGNOSIS — Z79899 Other long term (current) drug therapy: Secondary | ICD-10-CM | POA: Diagnosis not present

## 2021-10-25 DIAGNOSIS — K8010 Calculus of gallbladder with chronic cholecystitis without obstruction: Secondary | ICD-10-CM | POA: Diagnosis present

## 2021-10-25 DIAGNOSIS — K219 Gastro-esophageal reflux disease without esophagitis: Secondary | ICD-10-CM | POA: Insufficient documentation

## 2021-10-25 DIAGNOSIS — F17210 Nicotine dependence, cigarettes, uncomplicated: Secondary | ICD-10-CM | POA: Insufficient documentation

## 2021-10-25 SURGERY — CHOLECYSTECTOMY, ROBOT-ASSISTED, LAPAROSCOPIC
Anesthesia: General

## 2021-10-25 MED ORDER — FENTANYL CITRATE (PF) 100 MCG/2ML IJ SOLN
INTRAMUSCULAR | Status: AC
  Filled 2021-10-25: qty 2

## 2021-10-25 MED ORDER — LIDOCAINE HCL (CARDIAC) PF 100 MG/5ML IV SOSY
PREFILLED_SYRINGE | INTRAVENOUS | Status: DC | PRN
  Administered 2021-10-25: 65 mg via INTRAVENOUS

## 2021-10-25 MED ORDER — EPHEDRINE SULFATE (PRESSORS) 50 MG/ML IJ SOLN
INTRAMUSCULAR | Status: DC | PRN
Start: 2021-10-25 — End: 2021-10-25
  Administered 2021-10-25: 10 mg via INTRAVENOUS

## 2021-10-25 MED ORDER — MIDAZOLAM HCL 2 MG/2ML IJ SOLN
INTRAMUSCULAR | Status: AC
  Filled 2021-10-25: qty 2

## 2021-10-25 MED ORDER — FENTANYL CITRATE (PF) 100 MCG/2ML IJ SOLN
INTRAMUSCULAR | Status: AC
  Administered 2021-10-25: 25 ug via INTRAVENOUS
  Filled 2021-10-25: qty 2

## 2021-10-25 MED ORDER — PROPOFOL 10 MG/ML IV BOLUS
INTRAVENOUS | Status: AC
  Filled 2021-10-25: qty 20

## 2021-10-25 MED ORDER — ROCURONIUM BROMIDE 100 MG/10ML IV SOLN
INTRAVENOUS | Status: DC | PRN
  Administered 2021-10-25: 50 mg via INTRAVENOUS

## 2021-10-25 MED ORDER — LIDOCAINE HCL (PF) 2 % IJ SOLN
INTRAMUSCULAR | Status: AC
  Filled 2021-10-25: qty 5

## 2021-10-25 MED ORDER — CEFAZOLIN SODIUM-DEXTROSE 2-4 GM/100ML-% IV SOLN
INTRAVENOUS | Status: AC
  Filled 2021-10-25: qty 100

## 2021-10-25 MED ORDER — CELECOXIB 200 MG PO CAPS
ORAL_CAPSULE | ORAL | Status: AC
  Administered 2021-10-25: 200 mg via ORAL
  Filled 2021-10-25: qty 1

## 2021-10-25 MED ORDER — ACETAMINOPHEN 500 MG PO TABS
ORAL_TABLET | ORAL | Status: AC
  Administered 2021-10-25: 1000 mg via ORAL
  Filled 2021-10-25: qty 2

## 2021-10-25 MED ORDER — ONDANSETRON HCL 4 MG/2ML IJ SOLN
INTRAMUSCULAR | Status: AC
  Filled 2021-10-25: qty 2

## 2021-10-25 MED ORDER — EPHEDRINE 5 MG/ML INJ
INTRAVENOUS | Status: AC
  Filled 2021-10-25: qty 5

## 2021-10-25 MED ORDER — HYDROCODONE-ACETAMINOPHEN 5-325 MG PO TABS: 1.0000 | ORAL_TABLET | Freq: Four times a day (QID) | ORAL | 0 refills | Status: DC | PRN

## 2021-10-25 MED ORDER — KETOROLAC TROMETHAMINE 30 MG/ML IJ SOLN
30.0000 mg | Freq: Once | INTRAMUSCULAR | Status: AC
  Administered 2021-10-25: 30 mg via INTRAVENOUS

## 2021-10-25 MED ORDER — CHLORHEXIDINE GLUCONATE 0.12 % MT SOLN
OROMUCOSAL | Status: AC
  Administered 2021-10-25: 15 mL via OROMUCOSAL
  Filled 2021-10-25: qty 15

## 2021-10-25 MED ORDER — PROPOFOL 10 MG/ML IV BOLUS
INTRAVENOUS | Status: DC | PRN
  Administered 2021-10-25: 150 mg via INTRAVENOUS

## 2021-10-25 MED ORDER — MIDAZOLAM HCL 2 MG/2ML IJ SOLN
INTRAMUSCULAR | Status: DC | PRN
  Administered 2021-10-25: 2 mg via INTRAVENOUS

## 2021-10-25 MED ORDER — DEXMEDETOMIDINE HCL IN NACL 200 MCG/50ML IV SOLN
INTRAVENOUS | Status: DC | PRN
Start: 2021-10-25 — End: 2021-10-25
  Administered 2021-10-25: 12 ug via INTRAVENOUS
  Administered 2021-10-25: 8 ug via INTRAVENOUS

## 2021-10-25 MED ORDER — DEXMEDETOMIDINE HCL IN NACL 80 MCG/20ML IV SOLN
INTRAVENOUS | Status: AC
  Filled 2021-10-25: qty 20

## 2021-10-25 MED ORDER — SUGAMMADEX SODIUM 200 MG/2ML IV SOLN
INTRAVENOUS | Status: DC | PRN
  Administered 2021-10-25: 200 mg via INTRAVENOUS

## 2021-10-25 MED ORDER — IBUPROFEN 800 MG PO TABS: 800.0000 mg | ORAL_TABLET | Freq: Three times a day (TID) | ORAL | 0 refills | Status: AC | PRN

## 2021-10-25 MED ORDER — ROCURONIUM BROMIDE 10 MG/ML (PF) SYRINGE
PREFILLED_SYRINGE | INTRAVENOUS | Status: AC
  Filled 2021-10-25: qty 10

## 2021-10-25 MED ORDER — DEXAMETHASONE SODIUM PHOSPHATE 10 MG/ML IJ SOLN
INTRAMUSCULAR | Status: AC
  Filled 2021-10-25: qty 1

## 2021-10-25 MED ORDER — GABAPENTIN 300 MG PO CAPS
ORAL_CAPSULE | ORAL | Status: AC
  Administered 2021-10-25: 300 mg via ORAL
  Filled 2021-10-25: qty 1

## 2021-10-25 MED ORDER — ONDANSETRON HCL 4 MG/2ML IJ SOLN
INTRAMUSCULAR | Status: DC | PRN
  Administered 2021-10-25: 4 mg via INTRAVENOUS

## 2021-10-25 MED ORDER — BUPIVACAINE-EPINEPHRINE (PF) 0.25% -1:200000 IJ SOLN
INTRAMUSCULAR | Status: AC
  Filled 2021-10-25: qty 30

## 2021-10-25 MED ORDER — KETOROLAC TROMETHAMINE 30 MG/ML IJ SOLN
INTRAMUSCULAR | Status: AC
  Filled 2021-10-25: qty 1

## 2021-10-25 MED ORDER — ONDANSETRON HCL 4 MG/2ML IJ SOLN: 4.0000 mg | Freq: Once | INTRAMUSCULAR | Status: DC | PRN

## 2021-10-25 MED ORDER — FENTANYL CITRATE (PF) 100 MCG/2ML IJ SOLN
25.0000 ug | INTRAMUSCULAR | Status: DC | PRN
  Administered 2021-10-25 (×4): 25 ug via INTRAVENOUS

## 2021-10-25 MED ORDER — BUPIVACAINE-EPINEPHRINE (PF) 0.25% -1:200000 IJ SOLN
INTRAMUSCULAR | Status: DC | PRN
Start: 2021-10-25 — End: 2021-10-25
  Administered 2021-10-25: 30 mL

## 2021-10-25 MED ORDER — HYDROCODONE-ACETAMINOPHEN 5-325 MG PO TABS
ORAL_TABLET | ORAL | Status: AC
  Filled 2021-10-25: qty 1

## 2021-10-25 MED ORDER — HYDROCODONE-ACETAMINOPHEN 5-325 MG PO TABS
1.0000 | ORAL_TABLET | Freq: Once | ORAL | Status: AC
  Administered 2021-10-25: 1 via ORAL

## 2021-10-25 MED ORDER — APREPITANT 40 MG PO CAPS
ORAL_CAPSULE | ORAL | Status: AC
  Administered 2021-10-25: 40 mg via ORAL
  Filled 2021-10-25: qty 1

## 2021-10-25 MED ORDER — DEXAMETHASONE SODIUM PHOSPHATE 10 MG/ML IJ SOLN
INTRAMUSCULAR | Status: DC | PRN
Start: 2021-10-25 — End: 2021-10-25
  Administered 2021-10-25: 10 mg via INTRAVENOUS

## 2021-10-25 MED ORDER — FENTANYL CITRATE (PF) 100 MCG/2ML IJ SOLN
INTRAMUSCULAR | Status: DC | PRN
Start: 2021-10-25 — End: 2021-10-25
  Administered 2021-10-25 (×2): 50 ug via INTRAVENOUS

## 2021-10-25 SURGICAL SUPPLY — 48 items
ADH SKN CLS APL DERMABOND .7 (GAUZE/BANDAGES/DRESSINGS) ×1
BAG RETRIEVAL 10 (BASKET) ×1
CLIP LIGATING HEM O LOK PURPLE (MISCELLANEOUS) ×2 IMPLANT
COVER TIP SHEARS 8 DVNC (MISCELLANEOUS) ×1 IMPLANT
COVER TIP SHEARS 8MM DA VINCI (MISCELLANEOUS) ×2
DERMABOND ADVANCED (GAUZE/BANDAGES/DRESSINGS) ×1
DERMABOND ADVANCED .7 DNX12 (GAUZE/BANDAGES/DRESSINGS) ×1 IMPLANT
DRAPE ARM DVNC X/XI (DISPOSABLE) ×4 IMPLANT
DRAPE COLUMN DVNC XI (DISPOSABLE) ×1 IMPLANT
DRAPE DA VINCI XI ARM (DISPOSABLE) ×8
DRAPE DA VINCI XI COLUMN (DISPOSABLE) ×2
ELECT CAUTERY BLADE 6.4 (BLADE) ×2 IMPLANT
GLOVE SURG ORTHO LTX SZ7.5 (GLOVE) ×4 IMPLANT
GOWN STRL REUS W/ TWL LRG LVL3 (GOWN DISPOSABLE) ×2 IMPLANT
GOWN STRL REUS W/ TWL XL LVL3 (GOWN DISPOSABLE) ×2 IMPLANT
GOWN STRL REUS W/TWL LRG LVL3 (GOWN DISPOSABLE) ×6
GOWN STRL REUS W/TWL XL LVL3 (GOWN DISPOSABLE) ×4
GRASPER SUT TROCAR 14GX15 (MISCELLANEOUS) IMPLANT
INFUSOR MANOMETER BAG 3000ML (MISCELLANEOUS) IMPLANT
IRRIGATION STRYKERFLOW (MISCELLANEOUS) IMPLANT
IRRIGATOR STRYKERFLOW (MISCELLANEOUS)
IRRIGATOR SUCT 8 DISP DVNC XI (IRRIGATION / IRRIGATOR) IMPLANT
IRRIGATOR SUCTION 8MM XI DISP (IRRIGATION / IRRIGATOR)
IV NS IRRIG 3000ML ARTHROMATIC (IV SOLUTION) IMPLANT
KIT PINK PAD W/HEAD ARE REST (MISCELLANEOUS) ×2
KIT PINK PAD W/HEAD ARM REST (MISCELLANEOUS) ×1 IMPLANT
KIT TURNOVER KIT A (KITS) ×2 IMPLANT
LABEL OR SOLS (LABEL) ×2 IMPLANT
MANIFOLD NEPTUNE II (INSTRUMENTS) ×2 IMPLANT
NDL INSUFFLATION 14GA 120MM (NEEDLE) IMPLANT
NEEDLE HYPO 22GX1.5 SAFETY (NEEDLE) ×2 IMPLANT
NEEDLE INSUFFLATION 14GA 120MM (NEEDLE) ×2 IMPLANT
NS IRRIG 500ML POUR BTL (IV SOLUTION) ×2 IMPLANT
PACK LAP CHOLECYSTECTOMY (MISCELLANEOUS) ×2 IMPLANT
PENCIL ELECTRO HAND CTR (MISCELLANEOUS) ×2 IMPLANT
SEAL CANN UNIV 5-8 DVNC XI (MISCELLANEOUS) ×4 IMPLANT
SEAL XI 5MM-8MM UNIVERSAL (MISCELLANEOUS) ×8
SET TUBE SMOKE EVAC HIGH FLOW (TUBING) ×2 IMPLANT
SOLUTION ELECTROLUBE (MISCELLANEOUS) ×2 IMPLANT
SPIKE FLUID TRANSFER (MISCELLANEOUS) ×2 IMPLANT
SUT MNCRL 4-0 (SUTURE) ×2
SUT MNCRL 4-0 27XMFL (SUTURE) ×1
SUT VICRYL 0 AB UR-6 (SUTURE) ×2 IMPLANT
SUTURE MNCRL 4-0 27XMF (SUTURE) ×1 IMPLANT
SYS BAG RETRIEVAL 10MM (BASKET) ×1
SYSTEM BAG RETRIEVAL 10MM (BASKET) ×1 IMPLANT
TROCAR Z-THREAD FIOS 11X100 BL (TROCAR) IMPLANT
WATER STERILE IRR 500ML POUR (IV SOLUTION) ×1 IMPLANT

## 2021-10-25 NOTE — Transfer of Care (Signed)
Immediate Anesthesia Transfer of Care Note ? ?Patient: Courtney Villarreal ? ?Procedure(s) Performed: XI ROBOTIC ASSISTED LAPAROSCOPIC CHOLECYSTECTOMY ?INDOCYANINE GREEN FLUORESCENCE IMAGING (ICG) ? ?Patient Location: PACU ? ?Anesthesia Type:General ? ?Level of Consciousness: drowsy and patient cooperative ? ?Airway & Oxygen Therapy: Patient Spontanous Breathing and Patient connected to face mask oxygen ? ?Post-op Assessment: Report given to RN and Post -op Vital signs reviewed and stable ? ?Post vital signs: Reviewed and stable ? ?Last Vitals:  ?Vitals Value Taken Time  ?BP 115/80 10/25/21 0851  ?Temp 36.7 ?C 10/25/21 0851  ?Pulse 71 10/25/21 0853  ?Resp 21 10/25/21 0853  ?SpO2 100 % 10/25/21 0853  ?Vitals shown include unvalidated device data. ? ?Last Pain:  ?Vitals:  ? 10/25/21 0717  ?TempSrc: Temporal  ?PainSc: 4   ?   ? ?  ? ?Complications: No notable events documented. ?

## 2021-10-25 NOTE — Discharge Instructions (Signed)
AMBULATORY SURGERY  ?DISCHARGE INSTRUCTIONS ? ? ?The drugs that you were given will stay in your system until tomorrow so for the next 24 hours you should not: ? ?Drive an automobile ?Make any legal decisions ?Drink any alcoholic beverage ? ? ?You may resume regular meals tomorrow.  Today it is better to start with liquids and gradually work up to solid foods. ? ?You may eat anything you prefer, but it is better to start with liquids, then soup and crackers, and gradually work up to solid foods. ? ? ?Please notify your doctor immediately if you have any unusual bleeding, trouble breathing, redness and pain at the surgery site, drainage, fever, or pain not relieved by medication. ? ? ? ?Additional Instructions: ? ? ? ?Please contact your physician with any problems or Same Day Surgery at [phone], Monday through Friday 6 am to 4 pm, or [facility] number at [phone].  ?

## 2021-10-25 NOTE — Anesthesia Preprocedure Evaluation (Signed)
Anesthesia Evaluation  ?Patient identified by MRN, date of birth, ID band ?Patient awake ? ? ? ?Reviewed: ?Allergy & Precautions, H&P , NPO status , Patient's Chart, lab work & pertinent test results, reviewed documented beta blocker date and time  ? ?History of Anesthesia Complications ?(+) PONV and history of anesthetic complications ? ?Airway ?Mallampati: II ? ?TM Distance: >3 FB ?Neck ROM: full ? ? ? Dental ? ?(+) Teeth Intact ?  ?Pulmonary ?shortness of breath and with exertion, former smoker,  ?  ?Pulmonary exam normal ? ? ? ? ? ? ? Cardiovascular ?Exercise Tolerance: Good ?negative cardio ROS ?Normal cardiovascular exam ?Rhythm:regular Rate:Normal ? ? ?  ?Neuro/Psych ?negative neurological ROS ? negative psych ROS  ? GI/Hepatic ?Neg liver ROS, GERD  ,  ?Endo/Other  ?negative endocrine ROS ? Renal/GU ?negative Renal ROS  ?negative genitourinary ?  ?Musculoskeletal ? ? Abdominal ?  ?Peds ? Hematology ?negative hematology ROS ?(+)   ?Anesthesia Other Findings ?Past Medical History: ?No date: Cancer [facility]) ?    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    Comment:  L4-5, uses aleve ?No date: Degenerative disk disease ?No date: GERD (gastroesophageal reflux disease) ?    Comment:  takes otc zantac prn ?2009: History of chest pain ?    Comment:  had stress test in [facility]-requested copy of records-no  ?             cardiac findings per pt ?No date: PONV (postoperative nausea and vomiting) ?No date: Shortness of breath ?    Comment:  smoker ?Past Surgical History: ?04/10/2011: ABDOMINAL HYSTERECTOMY ?04/10/2011: ANTERIOR AND POSTERIOR REPAIR ?    Comment:  Procedure: ANTERIOR (CYSTOCELE) AND POSTERIOR REPAIR  ?             (RECTOCELE);  Surgeon: Shon Millet II;  Location: [place]  ?             ORS;  Service: Gynecology;  Laterality: N/A;   ?             Sacrospinous Ligament Suspension ?2013: BREAST EXCISIONAL BIOPSY; Right ?    Comment:   ADH ?05/07/12: BREAST SURGERY ?    Comment:  Rt partial mastectomy ?05/15/2019: COLONOSCOPY WITH PROPOFOL; N/A ?    Comment:  Procedure: COLONOSCOPY WITH PROPOFOL;  Surgeon: Allen Norris,  ?             Darren, MD;  Location: [place];  Service:  ?             Endoscopy;  Laterality: N/A; ?No date: conization of cervix ?    Comment:  age 80 ?04/10/2011: [place] ?    Comment:  Procedure: LAPAROSCOPIC ASSISTED VAGINAL HYSTERECTOMY;   ?             Surgeon: Shon Millet II;  Location: [facility];  Service: ?             Gynecology;  Laterality: N/A; ?05/15/2019: POLYPECTOMY ?    Comment:  Procedure: POLYPECTOMY;  Surgeon: Lucilla Lame, MD;   ?             Location: [place];  Service: Endoscopy;; ?04/10/2011: SALPINGOOPHORECTOMY ?    Comment:  Procedure: SALPINGO OOPHERECTOMY;  Surgeon: Daleen Bo  ?             Tomblin II;  Location: [facility];  Service: Gynecology;   ?  Laterality: Bilateral; ? ? Reproductive/Obstetrics ?negative OB ROS ? ?  ? ? ? ? ? ? ? ? ? ? ? ? ? ?  ?  ? ? ? ? ? ? ? ? ?Anesthesia Physical ?Anesthesia Plan ? ?ASA: 3 ? ?Anesthesia Plan: General ETT  ? ?Post-op Pain Management:   ? ?Induction:  ? ?PONV Risk Score and Plan:  ? ?Airway Management Planned:  ? ?Additional Equipment:  ? ?Intra-op Plan:  ? ?Post-operative Plan:  ? ?Informed Consent: I have reviewed the patients History and Physical, chart, labs and discussed the procedure including the risks, benefits and alternatives for the proposed anesthesia with the patient or authorized representative who has indicated his/her understanding and acceptance.  ? ? ? ?Dental Advisory Given ? ?Plan Discussed with: CRNA ? ?Anesthesia Plan Comments:   ? ? ? ? ? ? ?Anesthesia Quick Evaluation ? ?

## 2021-10-25 NOTE — Anesthesia Procedure Notes (Signed)
Procedure Name: Intubation ?Date/Time: 10/25/2021 7:44 AM ?Performed by: Jonna Clark, CRNA ?Pre-anesthesia Checklist: Patient identified, Patient being monitored, Timeout performed, Emergency Drugs available and Suction available ?Patient Re-evaluated:Patient Re-evaluated prior to induction ?Oxygen Delivery Method: Circle system utilized ?Preoxygenation: Pre-oxygenation with 100% oxygen ?Induction Type: IV induction ?Ventilation: Mask ventilation without difficulty ?Laryngoscope Size: 3 and McGraph ?Grade View: Grade I ?Tube type: Oral ?Tube size: 7.0 mm ?Number of attempts: 1 ?Airway Equipment and Method: Stylet ?Placement Confirmation: ETT inserted through vocal cords under direct vision, positive ETCO2 and breath sounds checked- equal and bilateral ?Secured at: 21 cm ?Tube secured with: Tape ?Dental Injury: Teeth and Oropharynx as per pre-operative assessment  ? ? ? ? ?

## 2021-10-25 NOTE — Op Note (Signed)
Robotic cholecystectomy with Indocyamine Green Ductal Imaging.  ? ?Pre-operative Diagnosis: Chronic calculus cholecystitis ? ?Post-operative Diagnosis:  Same. ? ?Procedure: Robotic assisted laparoscopic cholecystectomy with Indocyamine Green Ductal Imaging.  ? ?Surgeon: Ronny Bacon, M.D., FACS ? ?Anesthesia: General. with endotracheal tube ? ?Findings: no evidence of duodenal inflammation.  Diverticulum without adhesions.   ? ?Estimated Blood Loss: 15 mL ?        ?Drains: None ?        ?Specimens: Gallbladder     ?      ?Complications: none ? ?Procedure Details  ?The patient was seen again in the Holding Room.  2.5 mg dose of ICG was administered intravenously.   ?The benefits, complications, treatment options, risks and expected outcomes were again reviewed with the patient. The likelihood of improving the patient's symptoms with return to their baseline status is good.  The patient and/or family concurred with the proposed plan, giving informed consent, again alternatives reviewed.  The patient was taken to Operating Room, identified, and the procedure verified as robotic assisted laparoscopic cholecystectomy. ? ?Prior to the induction of general anesthesia, antibiotic prophylaxis was administered. VTE prophylaxis was in place. General endotracheal anesthesia was then administered and tolerated well. The patient was positioned in the supine position.  After the induction, the abdomen was prepped with Chloraprep and draped in the sterile fashion.  ?A Time Out was held and the above information confirmed. ? ?After local infiltration of quarter percent Marcaine with epinephrine, stab incision was made left upper quadrant.  Just below the costal margin at Palmer's point, approximately midclavicular line the Veres needle is passed with sensation of the layers to penetrate the abdominal wall and into the peritoneum.  Saline drop test is confirmed peritoneal placement.  Insufflation is initiated with carbon dioxide  to pressures of 15 mmHg. ? ?Right infra-umbilical local infiltration with quarter percent Marcaine with epinephrine is utilized.  Made a 12 mm incision on the right periumbilical site, I advanced an optical 29m port under direct visualization into the peritoneal cavity.  Once the peritoneum was penetrated, insufflation was initiated.  The trocar was then advanced into the abdominal cavity under direct visualization. ?Pneumoperitoneum was then continued with Air seal utilizing CO2 at 15 mmHg or less and tolerated well without any adverse changes in the patient's vital signs.  Two 8.5-mm ports were placed in the left lower quadrant and laterally, and one to the right lower quadrant, all under direct vision. All skin incisions  were infiltrated with a local anesthetic agent before making the incision and placing the trocars.  ?The patient was positioned  in reverse Trendelenburg, tilted the patient's left side down.  Da Vinci XI robot was then positioned on to the patient's left side, and docked. ? ?The gallbladder was identified, the fundus grasped via the arm 4 Prograsp and retracted cephalad. Adhesions were lysed with scissors and cautery.  The infundibulum was identified grasped and retracted laterally, exposing the peritoneum overlying the triangle of Calot. This was then opened and dissected using cautery & scissors. An extended critical view of the cystic duct and cystic artery was obtained, aided by the ICG via FireFly which enabled ready visualization of the ductal anatomy.   ? ?The cystic duct was clearly identified and dissected to isolation.   Artery well isolated and clipped, and the cystic duct was triple clipped and divided with scissors, as close to the gallbladder neck as feasible, thus leaving two on the remaining stump.  The specimen side of the  artery is sealed with bipolar and divided with monopolar scissors.  ? ?The gallbladder was taken from the gallbladder fossa in a retrograde fashion with the  electrocautery. The gallbladder was removed and placed in an Endocatch bag.  ?The liver bed is inspected. Hemostasis was confirmed.  ?The robot was undocked and moved away from the operative field. ?No irrigation was utilized.  The gallbladder and Endocatch sac were then removed through the infraumbilical port site.  ? ?Inspection of the right upper quadrant was performed. No bleeding, bile duct injury or leak, or bowel injury was noted. The infra-umbilical port site fascia was closed with interrumpted 0 Vicryl suture using PMI/cone under direct visualization. Pneumoperitoneum was released and ports removed.  4-0 subcuticular Monocryl was used to close the skin. Dermabond was  applied.  The patient was then extubated and brought to the recovery room in stable condition. Sponge, lap, and needle counts were correct at closure and at the conclusion of the case.  ?        ?     ?Ronny Bacon, M.D., FACS ?10/25/2021 8:57 AM ? ?

## 2021-10-25 NOTE — Progress Notes (Signed)
Sat and BP drop after last fentanyl dose.  ?

## 2021-10-25 NOTE — Interval H&P Note (Signed)
History and Physical Interval Note: ? ?10/25/2021 ?7:41 AM ? ?Courtney Villarreal  has presented today for surgery, with the diagnosis of Chronic calculous cholecystitis.  The various methods of treatment have been discussed with the patient and family. After consideration of risks, benefits and other options for treatment, the patient has consented to  Procedure(s): ?XI ROBOTIC ASSISTED LAPAROSCOPIC CHOLECYSTECTOMY (N/A) ?INDOCYANINE GREEN FLUORESCENCE IMAGING (ICG) (N/A) as a surgical intervention.  The patient's history has been reviewed, patient examined, no change in status, stable for surgery.  I have reviewed the patient's chart and labs.  Questions were answered to the patient's satisfaction.   ? ? ?Ronny Bacon ? ? ?

## 2021-10-26 NOTE — Anesthesia Postprocedure Evaluation (Signed)
Anesthesia Post Note ? ?Patient: Courtney Villarreal ? ?Procedure(s) Performed: XI ROBOTIC ASSISTED LAPAROSCOPIC CHOLECYSTECTOMY ?INDOCYANINE GREEN FLUORESCENCE IMAGING (ICG) ? ?Patient location during evaluation: PACU ?Anesthesia Type: General ?Level of consciousness: awake and alert ?Pain management: pain level controlled ?Vital Signs Assessment: post-procedure vital signs reviewed and stable ?Respiratory status: spontaneous breathing, nonlabored ventilation, respiratory function stable and patient connected to nasal cannula oxygen ?Cardiovascular status: blood pressure returned to baseline and stable ?Postop Assessment: no apparent nausea or vomiting ?Anesthetic complications: no ? ? ?No notable events documented. ? ? ?Last Vitals:  ?Vitals:  ? 10/25/21 0943 10/25/21 0955  ?BP: (!) 111/53 114/71  ?Pulse: 62 60  ?Resp: 12 16  ?Temp: 36.5 ?C (!) 36.1 ?C  ?SpO2: 95% 97%  ?  ?Last Pain:  ?Vitals:  ? 10/26/21 0935  ?TempSrc:   ?PainSc: 8   ? ? ?  ?  ?  ?  ?  ?  ? ?Courtney Villarreal ? ? ? ? ?

## 2021-11-01 LAB — SURGICAL PATHOLOGY

## 2021-11-03 ENCOUNTER — Ambulatory Visit
Admission: RE | Admit: 2021-11-03 | Discharge: 2021-11-03 | Disposition: A | Discharge status: Home / Self Care | Payer: BC Managed Care – PPO | Source: Ambulatory Visit | Attending: Physician Assistant | Admitting: Physician Assistant

## 2021-11-03 DIAGNOSIS — Z1231 Encounter for screening mammogram for malignant neoplasm of breast: Secondary | ICD-10-CM | POA: Insufficient documentation

## 2021-11-09 ENCOUNTER — Ambulatory Visit (INDEPENDENT_AMBULATORY_CARE_PROVIDER_SITE_OTHER): Payer: BC Managed Care – PPO | Admitting: Physician Assistant

## 2021-11-09 ENCOUNTER — Encounter: Payer: Self-pay | Admitting: Physician Assistant

## 2021-11-09 VITALS — BP 122/79 | HR 65 | Temp 97.9°F | Ht 64.0 in | Wt 168.0 lb

## 2021-11-09 DIAGNOSIS — Z09 Encounter for follow-up examination after completed treatment for conditions other than malignant neoplasm: Secondary | ICD-10-CM

## 2021-11-09 DIAGNOSIS — K8010 Calculus of gallbladder with chronic cholecystitis without obstruction: Secondary | ICD-10-CM

## 2021-11-09 NOTE — Patient Instructions (Addendum)
If you have any concerns or questions, please feel free to call our office. See follow up appointment below.  ? ? ?GENERAL POST-OPERATIVE ?PATIENT INSTRUCTIONS  ? ?WOUND CARE INSTRUCTIONS:  Keep a dry clean dressing on the wound if there is drainage. The initial bandage may be removed after 24 hours.  Once the wound has quit draining you may leave it open to air.  If clothing rubs against the wound or causes irritation and the wound is not draining you may cover it with a dry dressing during the daytime.  Try to keep the wound dry and avoid ointments on the wound unless directed to do so.  If the wound becomes bright red and painful or starts to drain infected material that is not clear, please contact your physician immediately.  If the wound is mildly pink and has a thick firm ridge underneath it, this is normal, and is referred to as a healing ridge.  This will resolve over the next 4-6 weeks. ? ?BATHING: ?You may shower if you have been informed of this by your surgeon. However, Please do not submerge in a tub, hot tub, or pool until incisions are completely sealed or have been told by your surgeon that you may do so. ? ?DIET:  You may eat any foods that you can tolerate.  It is a good idea to eat a high fiber diet and take in plenty of fluids to prevent constipation.  If you do become constipated you may want to take a mild laxative or take ducolax tablets on a daily basis until your bowel habits are regular.  Constipation can be very uncomfortable, along with straining, after recent surgery. ? ?ACTIVITY:  You are encouraged to cough and deep breath or use your incentive spirometer if you were given one, every 15-30 minutes when awake.  This will help prevent respiratory complications and low grade fevers post-operatively if you had a general anesthetic.  You may want to hug a pillow when coughing and sneezing to add additional support to the surgical area, if you had abdominal or chest surgery, which will  decrease pain during these times.  You are encouraged to walk and engage in light activity for the next two weeks.  You should not lift, push or pull more than 15-20 pounds for 4 weeks total after surgery as it could put you at increased risk for complications.  Twenty pounds is roughly equivalent to a plastic bag of groceries. At that time- Listen to your body when lifting, if you have pain when lifting, stop and then try again in a few days. Soreness after doing exercises or activities of daily living is normal as you get back in to your normal routine. ? ?MEDICATIONS:  Try to take narcotic medications and anti-inflammatory medications, such as tylenol, ibuprofen, naprosyn, etc., with food.  This will minimize stomach upset from the medication.  Should you develop nausea and vomiting from the pain medication, or develop a rash, please discontinue the medication and contact your physician.  You should not drive, make important decisions, or operate machinery when taking narcotic pain medication. ? ?SUNBLOCK ?Use sun block to incision area over the next year if this area will be exposed to sun. This helps decrease scarring and will allow you avoid a permanent darkened area over your incision. ? ?QUESTIONS:  Please feel free to call our office if you have any questions, and we will be glad to assist you. [phone] ? ?

## 2021-11-09 NOTE — Progress Notes (Signed)
[facility] ?POST-OP OFFICE VISIT ? ?11/09/2021 ? ?HPI: ?Courtney Villarreal is a 61 y.o. female 15 days s/p robotic assisted laparoscopic cholecystectomy for [facility] with Dr Christian Mate  ? ?Some left sided sharp abdominal pain , worse with bending to that side ?Only taking ibuprofen at the moment ?No fever, chills, nausea, emesis ?Stools are looser but this has improved slowly ?No issues with incisions themselves ?No other complaints ? ?Vital signs: ?BP 122/79   Pulse 65   Temp 97.9 ?F (36.6 ?C) (Oral)   Ht '5\' 4"'$  (1.626 m)   Wt 168 lb (76.2 kg)   SpO2 100%   BMI 28.84 kg/m?   ? ?Physical Exam: ?Constitutional: Well appearing female, NAD ?Abdomen: Soft, She does not appear overtly tender, non-distended, no rebound/guarding ?Skin: Laparoscopic incisions are healing well, no erythema or drainage  ? ?Assessment/Plan: ?This is a 61 y.o. female 15 days s/p robotic assisted laparoscopic cholecystectomy for [facility] ? ? - Pain control prn ? - Reviewed wound care recommendation ? - Reviewed lifting restrictions; 4 weeks total ? - Reviewed surgical pathology; Chronic cholecystitis; negative for malignancy ? - She will follow up in 3 weeks for re-check at patient request  ? ?-- ?Edison Simon, PA-C ?[facility] ?11/09/2021, 1:44 PM ?M-F: 7am - 4pm ? ?

## 2021-11-15 ENCOUNTER — Telehealth: Payer: Self-pay | Admitting: *Deleted

## 2021-11-15 NOTE — Telephone Encounter (Signed)
Faxed FMLA to [employer] [phone]  ?

## 2021-11-30 ENCOUNTER — Ambulatory Visit (INDEPENDENT_AMBULATORY_CARE_PROVIDER_SITE_OTHER): Payer: BC Managed Care – PPO | Admitting: Physician Assistant

## 2021-11-30 ENCOUNTER — Encounter: Payer: Self-pay | Admitting: Physician Assistant

## 2021-11-30 VITALS — BP 122/75 | HR 71 | Temp 97.8°F | Ht 64.0 in | Wt 169.0 lb

## 2021-11-30 DIAGNOSIS — K8010 Calculus of gallbladder with chronic cholecystitis without obstruction: Secondary | ICD-10-CM

## 2021-11-30 DIAGNOSIS — Z09 Encounter for follow-up examination after completed treatment for conditions other than malignant neoplasm: Secondary | ICD-10-CM

## 2021-11-30 NOTE — Patient Instructions (Signed)

## 2021-11-30 NOTE — Progress Notes (Signed)
[facility] POST-OP OFFICE VISIT  11/30/2021  HPI: Courtney Villarreal is a 61 y.o. female ~1 month s/p robotic assisted laparoscopic cholecystectomy for [facility] with Dr Christian Mate   She is doing better; still with some left sided soreness but this is much improved. Only using tylenol No fever, chills, nausea, emesis She does have some diarrhea Incisions are healed No other complaints   Vital signs: BP 122/75   Pulse 71   Temp 97.8 F (36.6 C)   Ht '5\' 4"'$  (1.626 m)   Wt 169 lb (76.7 kg)   SpO2 100%   BMI 29.01 kg/m    Physical Exam: Constitutional: Well appearing female, NAD Abdomen: Soft, non-tender, non-distended, no rebound/guarding Skin: Laparoscopic incisions are healing well, no erythema or drainage   Assessment/Plan: This is a 61 y.o. female 1 month days s/p robotic assisted laparoscopic cholecystectomy for [facility] with Dr Christian Mate    - Pain control prn  - Reviewed lifting restrictions; completed these; letters given  - She can follow up on as needed basis; She understands to call with questions/concerns  -- Edison Simon, PA-C [facility] 11/30/2021, 2:15 PM M-F: 7am - 4pm

## 2022-01-19 ENCOUNTER — Inpatient Hospital Stay: Admit: 2022-01-22 | Primary: Physician Assistant

## 2022-02-23 ENCOUNTER — Other Ambulatory Visit: Payer: Self-pay | Admitting: Physician Assistant

## 2022-02-23 DIAGNOSIS — F17211 Nicotine dependence, cigarettes, in remission: Secondary | ICD-10-CM

## 2022-02-23 DIAGNOSIS — Z720 Tobacco use: Secondary | ICD-10-CM

## 2022-03-29 ENCOUNTER — Encounter

## 2022-04-05 ENCOUNTER — Inpatient Hospital Stay: Admit: 2022-04-05 | Payer: BLUE CROSS/BLUE SHIELD | Primary: Physician Assistant

## 2022-04-05 DIAGNOSIS — G8190 Hemiplegia, unspecified affecting unspecified side: Secondary | ICD-10-CM

## 2022-04-10 ENCOUNTER — Inpatient Hospital Stay: Admit: 2022-04-10 | Payer: BLUE CROSS/BLUE SHIELD | Primary: Physician Assistant

## 2022-04-10 DIAGNOSIS — M545 Low back pain, unspecified: Secondary | ICD-10-CM

## 2022-04-10 DIAGNOSIS — M5412 Radiculopathy, cervical region: Secondary | ICD-10-CM

## 2022-05-03 ENCOUNTER — Encounter

## 2022-05-08 ENCOUNTER — Inpatient Hospital Stay: Admit: 2022-05-08 | Payer: BLUE CROSS/BLUE SHIELD | Primary: Physician Assistant

## 2022-05-08 DIAGNOSIS — S12120K Other displaced dens fracture, subsequent encounter for fracture with nonunion: Secondary | ICD-10-CM

## 2022-05-08 DIAGNOSIS — S12100A Unspecified displaced fracture of second cervical vertebra, initial encounter for closed fracture: Secondary | ICD-10-CM

## 2022-05-14 ENCOUNTER — Encounter

## 2022-05-24 ENCOUNTER — Ambulatory Visit: Payer: BLUE CROSS/BLUE SHIELD | Primary: Physician Assistant

## 2022-05-29 ENCOUNTER — Ambulatory Visit: Payer: BLUE CROSS/BLUE SHIELD | Primary: Physician Assistant

## 2022-06-14 ENCOUNTER — Inpatient Hospital Stay: Admit: 2022-06-15 | Payer: BLUE CROSS/BLUE SHIELD | Primary: Physician Assistant

## 2022-06-19 ENCOUNTER — Encounter

## 2022-07-11 ENCOUNTER — Encounter

## 2022-07-20 NOTE — Procedures (Signed)
Formatting of this note might be different from the original.  [place]    Patient Name: [name]  Patient MRN:  [id number]    PROPOSED PROCEDURE:   Fluoroscopic guided cervical and lumbar myelogram    I have discussed the above procedure with this patient,  [name].     Included in the discussion were:  The nature of the proposed procedure;  Potential benefits, risks, or side effects, including potential problems that might occur during recuperation;  The likelihood of achieving goals;  Reasonable alternatives;  The relevant risks, benefits, and side effects related to alternatives, including the possible results of not receiving care, treatment, and services;  The risks, benefits, and alternatives of potential blood and/or blood products were discussed and she agrees.  Patient/Patient Representative (as above) will designate their consent to receive a transfusion of blood/blood products by their signature on the separate blood consent form;  When indicated, any limitations on the confidentiality of information learned from or about the patient.    Any procedure-specific risks and alternatives discuss: Bleeding and infection.    All questions were answered and she consented to proceed.  Electronically signed by Delila Spence, NP at 07/20/2022 12:01 PM EST

## 2022-07-27 ENCOUNTER — Ambulatory Visit: Payer: BLUE CROSS/BLUE SHIELD | Primary: Physician Assistant

## 2022-07-27 DIAGNOSIS — Z122 Encounter for screening for malignant neoplasm of respiratory organs: Secondary | ICD-10-CM

## 2022-07-27 DIAGNOSIS — Z87891 Personal history of nicotine dependence: Secondary | ICD-10-CM

## 2022-08-08 ENCOUNTER — Encounter

## 2022-08-15 ENCOUNTER — Ambulatory Visit: Payer: BLUE CROSS/BLUE SHIELD | Primary: Physician Assistant

## 2022-08-15 ENCOUNTER — Inpatient Hospital Stay: Admit: 2022-08-15 | Payer: BLUE CROSS/BLUE SHIELD | Attending: Neurological Surgery | Primary: Physician Assistant

## 2022-08-15 DIAGNOSIS — S12112A Nondisplaced Type II dens fracture, initial encounter for closed fracture: Secondary | ICD-10-CM

## 2022-08-15 DIAGNOSIS — S12100A Unspecified displaced fracture of second cervical vertebra, initial encounter for closed fracture: Secondary | ICD-10-CM

## 2022-08-15 MED ORDER — IOPAMIDOL 76 % IV SOLN
76 % | Freq: Once | INTRAVENOUS | Status: AC | PRN
Start: 2022-08-15 — End: 2022-08-15
  Administered 2022-08-15: 21:00:00 85 mL via INTRAVENOUS

## 2022-08-15 MED FILL — ISOVUE-370 76 % IV SOLN: 76 % | INTRAVENOUS | Qty: 85

## 2022-08-29 ENCOUNTER — Inpatient Hospital Stay: Admit: 2022-08-30 | Payer: BLUE CROSS/BLUE SHIELD | Primary: Physician Assistant

## 2022-09-17 ENCOUNTER — Inpatient Hospital Stay: Admit: 2022-09-18 | Payer: BLUE CROSS/BLUE SHIELD | Primary: Physician Assistant

## 2022-10-02 ENCOUNTER — Inpatient Hospital Stay: Admit: 2022-10-03 | Payer: BLUE CROSS/BLUE SHIELD | Primary: Physician Assistant

## 2022-10-12 ENCOUNTER — Encounter

## 2022-10-30 ENCOUNTER — Inpatient Hospital Stay: Admit: 2022-10-30 | Payer: BLUE CROSS/BLUE SHIELD | Primary: Physician Assistant

## 2022-10-30 DIAGNOSIS — R1909 Other intra-abdominal and pelvic swelling, mass and lump: Secondary | ICD-10-CM

## 2022-10-30 MED ORDER — GADOBUTROL 1 MMOL/ML IV SOLN
1 | Freq: Once | INTRAVENOUS | Status: AC | PRN
Start: 2022-10-30 — End: 2022-10-30
  Administered 2022-10-30: 23:00:00 10 mL via INTRAVENOUS

## 2022-10-30 MED FILL — GADAVIST 1 MMOL/ML IV SOLN: 1 MMOL/ML | INTRAVENOUS | Qty: 15

## 2022-11-19 ENCOUNTER — Inpatient Hospital Stay: Admit: 2022-11-20 | Payer: BLUE CROSS/BLUE SHIELD | Primary: Physician Assistant

## 2022-12-20 ENCOUNTER — Inpatient Hospital Stay: Admit: 2022-12-21 | Payer: BLUE CROSS/BLUE SHIELD | Primary: Physician Assistant

## 2023-01-16 IMAGING — MG MM DIGITAL SCREENING BILAT W/ TOMO AND CAD
8 series · 8 of 24 positions shown · non-contrast
Comparison: Previous exam(s).

CLINICAL DATA: Screening.

EXAM:
DIGITAL SCREENING BILATERAL MAMMOGRAM WITH TOMOSYNTHESIS AND CAD
TECHNIQUE: Bilateral screening digital craniocaudal and mediolateral oblique
mammograms were obtained. Bilateral screening digital breast
tomosynthesis was performed. The images were evaluated with
computer-aided detection.

[R CC synth-2D]
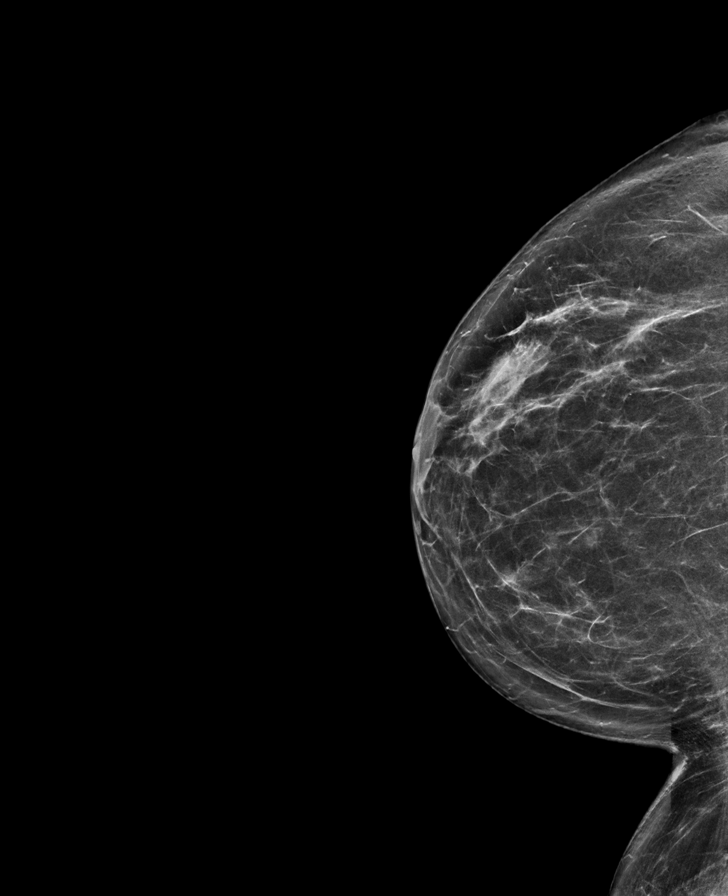

[L CC synth-2D]
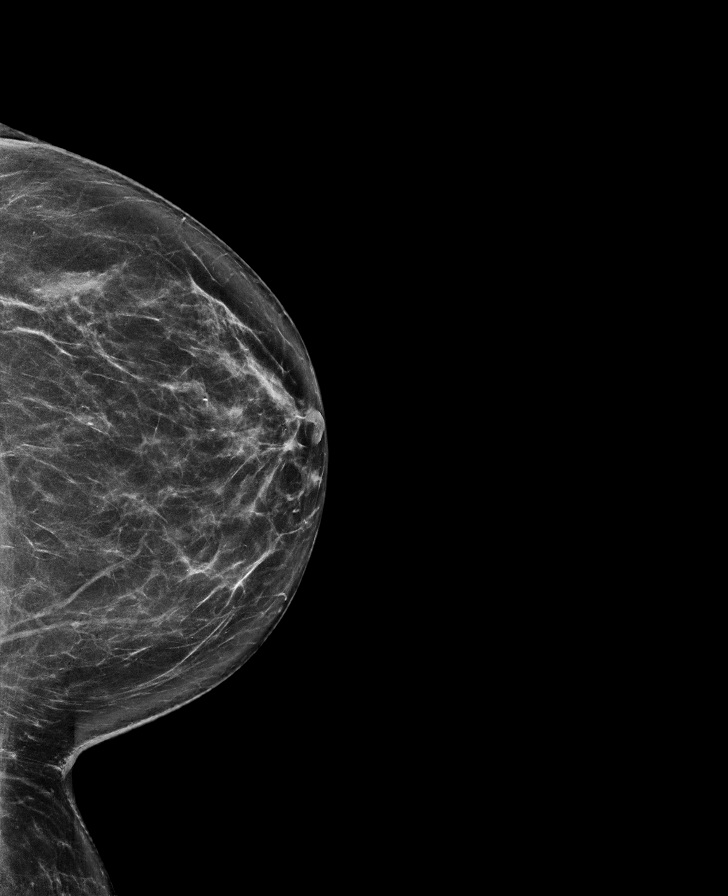

[L MLO synth-2D]
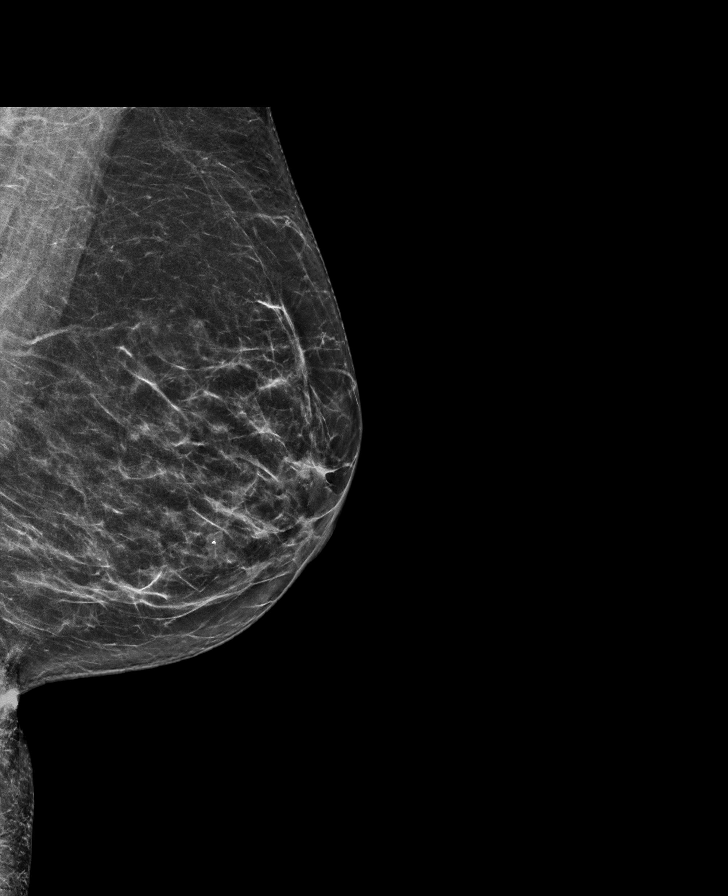

[R MLO synth-2D]
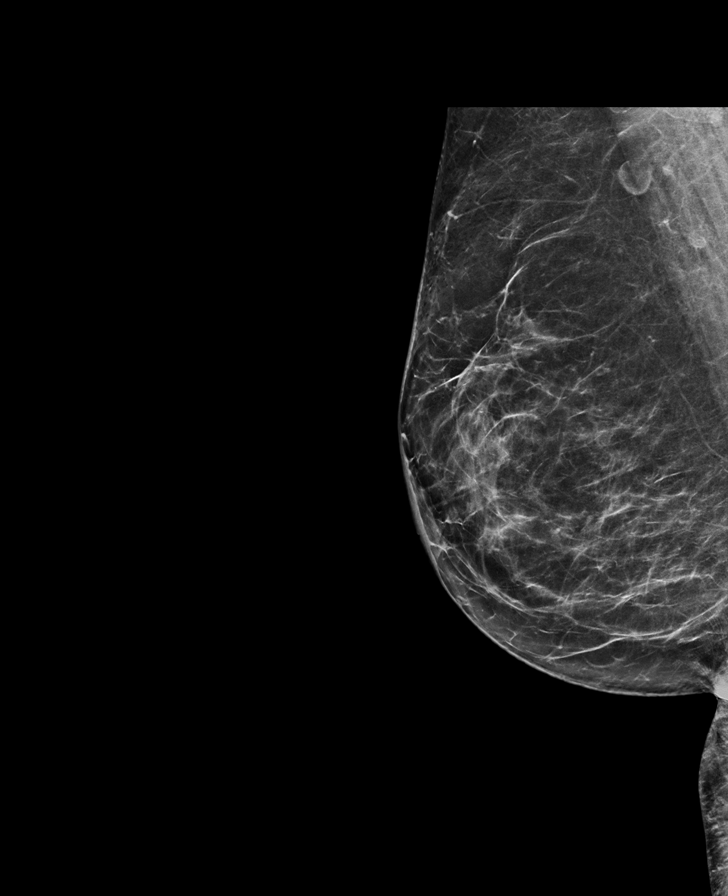

[L MLO tomo · tomo slice 37/73.0]
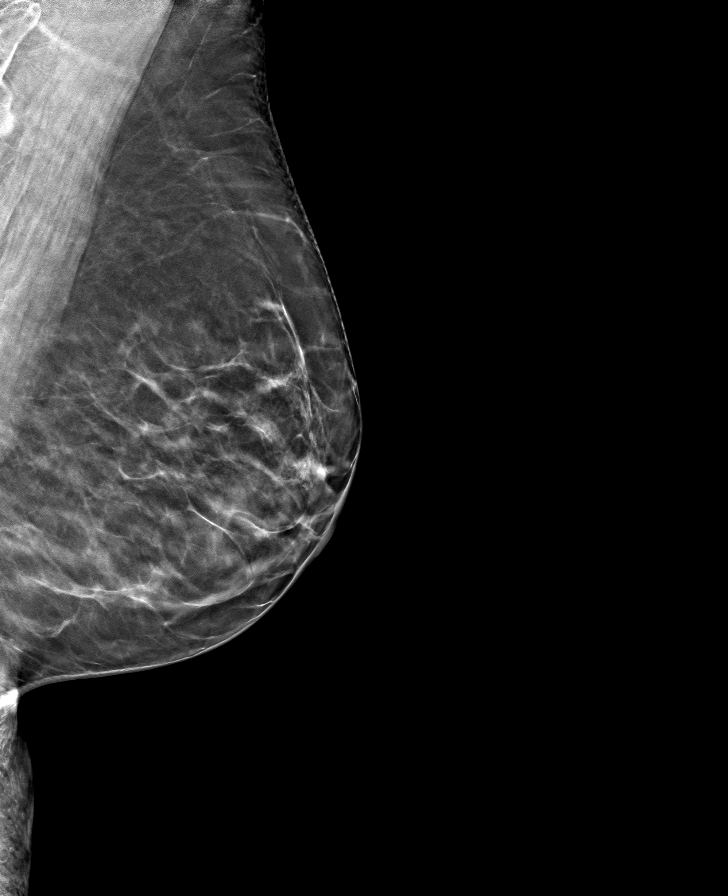

[R CC tomo · tomo slice 37/72.0]
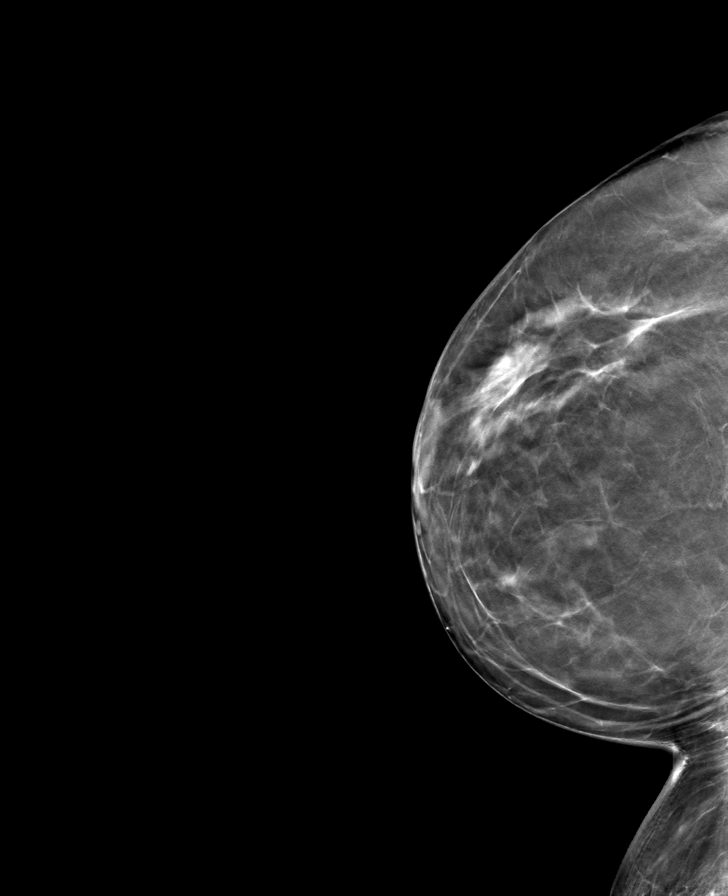

[R MLO tomo · tomo slice 38/75.0]
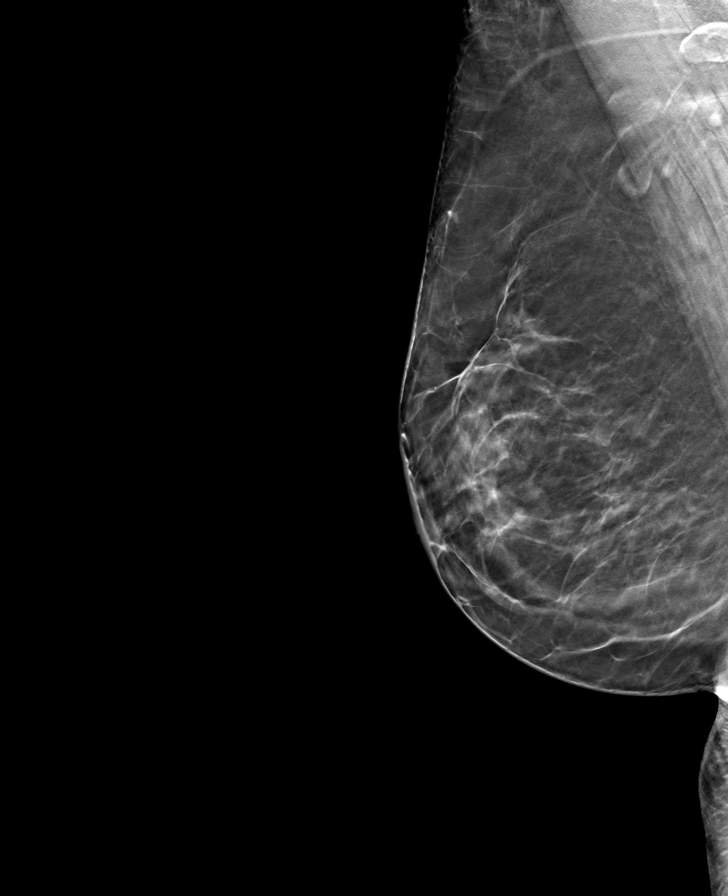

[L CC tomo · tomo slice 39/76.0]
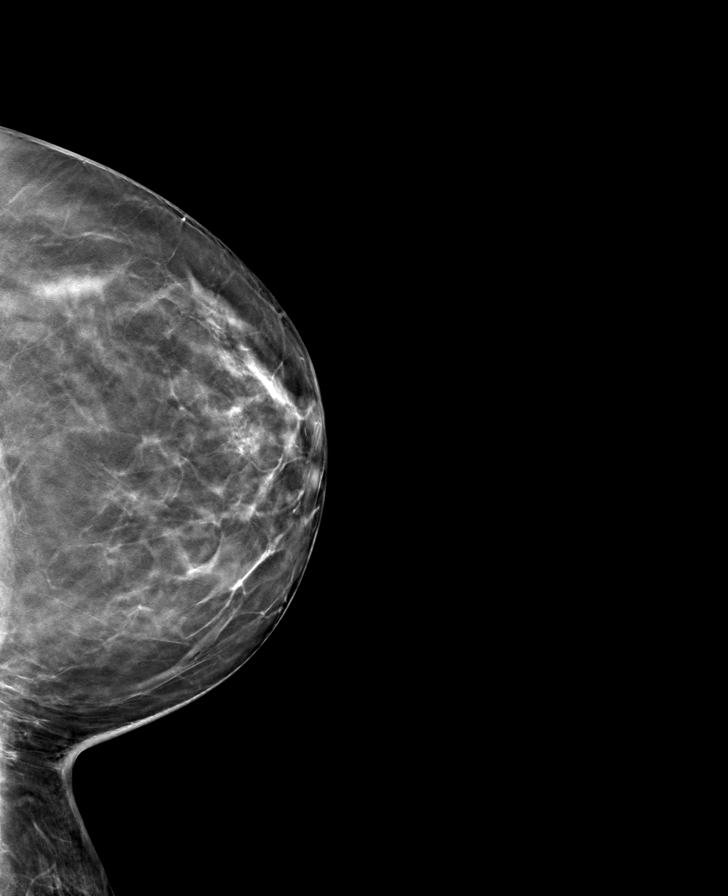

[8 of 24 positions shown; findings below may reference images not displayed]

ACR Breast Density Category b: There are scattered areas of
fibroglandular density.
FINDINGS: There are no findings suspicious for malignancy.
IMPRESSION: No mammographic evidence of malignancy. A result letter of this
screening mammogram will be mailed directly to the patient.

RECOMMENDATION:
Screening mammogram in one year. (Code:51-O-LD2)

BI-RADS CATEGORY  1: Negative.

## 2023-02-20 ENCOUNTER — Other Ambulatory Visit: Payer: Self-pay | Admitting: Physician Assistant

## 2023-02-20 DIAGNOSIS — Z1231 Encounter for screening mammogram for malignant neoplasm of breast: Secondary | ICD-10-CM

## 2023-03-05 ENCOUNTER — Ambulatory Visit
Admission: RE | Admit: 2023-03-05 | Discharge: 2023-03-05 | Disposition: A | Discharge status: Home / Self Care | Payer: BC Managed Care – PPO | Source: Ambulatory Visit | Attending: Physician Assistant | Admitting: Physician Assistant

## 2023-03-05 DIAGNOSIS — Z1231 Encounter for screening mammogram for malignant neoplasm of breast: Secondary | ICD-10-CM | POA: Diagnosis present

## 2023-03-07 ENCOUNTER — Other Ambulatory Visit: Payer: Self-pay | Admitting: Physician Assistant

## 2023-03-07 DIAGNOSIS — M7989 Other specified soft tissue disorders: Secondary | ICD-10-CM

## 2023-03-12 ENCOUNTER — Other Ambulatory Visit: Payer: Self-pay | Admitting: Physician Assistant

## 2023-03-12 DIAGNOSIS — F17211 Nicotine dependence, cigarettes, in remission: Secondary | ICD-10-CM

## 2023-03-12 DIAGNOSIS — Z0000 Encounter for general adult medical examination without abnormal findings: Secondary | ICD-10-CM

## 2023-03-13 ENCOUNTER — Ambulatory Visit
Admission: RE | Admit: 2023-03-13 | Discharge: 2023-03-13 | Disposition: A | Discharge status: Home / Self Care | Payer: BC Managed Care – PPO | Source: Ambulatory Visit | Attending: Physician Assistant | Admitting: Physician Assistant

## 2023-03-13 DIAGNOSIS — M7989 Other specified soft tissue disorders: Secondary | ICD-10-CM | POA: Insufficient documentation

## 2023-03-27 ENCOUNTER — Ambulatory Visit
Admission: RE | Admit: 2023-03-27 | Discharge: 2023-03-27 | Disposition: A | Discharge status: Home / Self Care | Payer: BC Managed Care – PPO | Source: Ambulatory Visit | Attending: Physician Assistant | Admitting: Physician Assistant

## 2023-03-27 DIAGNOSIS — F17211 Nicotine dependence, cigarettes, in remission: Secondary | ICD-10-CM | POA: Insufficient documentation

## 2023-03-27 DIAGNOSIS — Z0000 Encounter for general adult medical examination without abnormal findings: Secondary | ICD-10-CM | POA: Diagnosis present

## 2023-03-29 ENCOUNTER — Inpatient Hospital Stay: Admit: 2023-03-29 | Payer: BLUE CROSS/BLUE SHIELD | Primary: Physician Assistant

## 2023-03-29 ENCOUNTER — Encounter

## 2023-03-29 ENCOUNTER — Inpatient Hospital Stay: Admit: 2023-03-29 | Payer: BLUE CROSS/BLUE SHIELD | Attending: Neurological Surgery | Primary: Physician Assistant

## 2023-03-29 DIAGNOSIS — Z01810 Encounter for preprocedural cardiovascular examination: Secondary | ICD-10-CM

## 2023-03-29 DIAGNOSIS — Z01811 Encounter for preprocedural respiratory examination: Secondary | ICD-10-CM

## 2023-03-29 LAB — CBC WITH AUTO DIFFERENTIAL
Basophils: 0.5 % (ref 0–3)
Eosinophils: 0.7 % (ref 0–5)
Hematocrit: 42.6 % (ref 35.0–47.0)
Hemoglobin: 14.2 g/dL (ref 11.0–16.0)
Immature Granulocytes %: 0.5 % (ref 0.0–3.0)
Lymphocytes: 16.6 % — ABNORMAL LOW (ref 28–48)
MCH: 32.7 pg (ref 25.4–34.6)
MCHC: 33.3 g/dL (ref 30.0–36.0)
MCV: 98.2 fL — ABNORMAL HIGH (ref 80.0–98.0)
MPV: 9.9 fL (ref 6.0–10.0)
Monocytes: 7.4 % (ref 1–13)
Neutrophils Segmented: 74.3 % — ABNORMAL HIGH (ref 34–64)
Nucleated RBCs: 0 (ref 0–0)
Platelets: 343 10*3/uL (ref 140–450)
RBC: 4.34 M/uL (ref 3.60–5.20)
RDW: 55.7 — ABNORMAL HIGH (ref 36.4–46.3)
WBC: 11.1 10*3/uL — ABNORMAL HIGH (ref 4.0–11.0)

## 2023-03-29 LAB — EKG 12-LEAD
Atrial Rate: 99 {beats}/min
Calculated P Axis: 63 degrees
Calculated R Axis: 61 degrees
Calculated T Axis: 53 degrees
DIAGNOSIS, 93000: NORMAL
P-R Interval: 122 ms
Q-T Interval: 344 ms
QRS Duration: 82 ms
QTC Calculation (Bezet): 441 ms
Ventricular Rate: 99 {beats}/min

## 2023-03-29 LAB — BASIC METABOLIC PANEL
Anion Gap: 4 mmol/L — ABNORMAL LOW (ref 5–15)
BUN: 13 mg/dL (ref 9–23)
CO2: 25 meq/L (ref 20–31)
Calcium: 10 mg/dL (ref 8.7–10.4)
Chloride: 110 meq/L — ABNORMAL HIGH (ref 98–107)
Creatinine: 0.84 mg/dL (ref 0.55–1.02)
GFR African American: >60
GFR Non-African American: >60
Glucose: 123 mg/dL — ABNORMAL HIGH (ref 74–106)
Potassium: 4.2 meq/L (ref 3.5–5.1)
Sodium: 139 meq/L (ref 136–145)

## 2023-03-29 LAB — PROTIME-INR
INR: 0.9 (ref 0.1–1.1)
Protime: 10.1 s — ABNORMAL LOW (ref 10.2–12.9)

## 2023-04-04 ENCOUNTER — Inpatient Hospital Stay: Admit: 2023-04-08 | Payer: BLUE CROSS/BLUE SHIELD | Primary: Physician Assistant

## 2023-06-06 NOTE — Progress Notes (Signed)
 PREOPERATIVE INSTRUCTIONS  Please Read Carefully    [x]  Your procedure is scheduled on: 06/07/23     []  The day before your surgery, call the surgeon's office to check on the surgery time and the time you should arrive.     [x]  On the day of you

## 2023-06-07 ENCOUNTER — Inpatient Hospital Stay
Admit: 2023-06-07 | Discharge: 2023-06-10 | Disposition: A | Discharge status: Home / Self Care | Payer: BLUE CROSS/BLUE SHIELD | Attending: Neurological Surgery | Admitting: Neurological Surgery

## 2023-06-07 ENCOUNTER — Inpatient Hospital Stay: Admit: 2023-06-07 | Payer: BLUE CROSS/BLUE SHIELD | Primary: Physician Assistant

## 2023-06-07 DIAGNOSIS — S12110K Anterior displaced Type II dens fracture, subsequent encounter for fracture with nonunion: Secondary | ICD-10-CM

## 2023-06-07 MED ORDER — KETOROLAC TROMETHAMINE 30 MG/ML IJ SOLN
30 | INTRAMUSCULAR | Status: AC
Start: 2023-06-07 — End: ?

## 2023-06-07 MED ORDER — MIDAZOLAM HCL 2 MG/2ML IJ SOLN
2 | Freq: Once | INTRAMUSCULAR | Status: DC | PRN
Start: 2023-06-07 — End: 2023-06-07
  Administered 2023-06-07: 16:00:00 2 via INTRAVENOUS

## 2023-06-07 MED ORDER — CEFAZOLIN SODIUM 1 G IJ SOLR
1 | Freq: Once | INTRAMUSCULAR | Status: DC | PRN
Start: 2023-06-07 — End: 2023-06-07
  Administered 2023-06-07 (×2): 2 via INTRAVENOUS

## 2023-06-07 MED ORDER — MIDAZOLAM HCL 2 MG/2ML IJ SOLN
2 | INTRAMUSCULAR | Status: AC
Start: 2023-06-07 — End: ?

## 2023-06-07 MED ORDER — PANTOPRAZOLE SODIUM 40 MG PO TBEC
40 | Freq: Every day | ORAL | Status: DC
Start: 2023-06-07 — End: 2023-06-10
  Administered 2023-06-08 – 2023-06-10 (×4): 40 mg via ORAL

## 2023-06-07 MED ORDER — LIDOCAINE-EPINEPHRINE 1 %-1:100000 IJ SOLN
1 | INTRAMUSCULAR | Status: AC
Start: 2023-06-07 — End: ?

## 2023-06-07 MED ORDER — SUGAMMADEX SODIUM 200 MG/2ML IV SOLN
200 | Freq: Once | INTRAVENOUS | Status: DC | PRN
Start: 2023-06-07 — End: 2023-06-07
  Administered 2023-06-07: 21:00:00 200 via INTRAVENOUS

## 2023-06-07 MED ORDER — ALBUTEROL SULFATE HFA 108 (90 BASE) MCG/ACT IN AERS
108 | RESPIRATORY_TRACT | Status: DC | PRN
Start: 2023-06-07 — End: 2023-06-10
  Administered 2023-06-08 – 2023-06-09 (×2): 1 via RESPIRATORY_TRACT

## 2023-06-07 MED ORDER — LIDOCAINE HCL (PF) 2 % IJ SOLN
2 | Freq: Once | INTRAMUSCULAR | Status: DC | PRN
Start: 2023-06-07 — End: 2023-06-07
  Administered 2023-06-07: 16:00:00 80 via INTRAVENOUS

## 2023-06-07 MED ORDER — DEXAMETHASONE SODIUM PHOSPHATE 10 MG/ML IJ SOLN
10 | Freq: Four times a day (QID) | INTRAMUSCULAR | Status: AC
Start: 2023-06-07 — End: 2023-06-08
  Administered 2023-06-07 – 2023-06-08 (×4): 4 mg via INTRAVENOUS

## 2023-06-07 MED ORDER — DEXAMETHASONE SODIUM PHOSPHATE 4 MG/ML IJ SOLN
4 | Freq: Once | INTRAMUSCULAR | Status: DC | PRN
Start: 2023-06-07 — End: 2023-06-07
  Administered 2023-06-07: 17:00:00 10 via INTRAVENOUS

## 2023-06-07 MED ORDER — SODIUM CHLORIDE 0.9 % IV SOLN
0.9 | INTRAVENOUS | Status: DC | PRN
Start: 2023-06-07 — End: 2023-06-07

## 2023-06-07 MED ORDER — SENNA-DOCUSATE SODIUM 8.6-50 MG PO TABS
8.6-50 MG | Freq: Two times a day (BID) | ORAL | Status: AC
Start: 2023-06-07 — End: 2023-06-10
  Administered 2023-06-08 – 2023-06-10 (×6): 1 via ORAL

## 2023-06-07 MED ORDER — BACLOFEN 10 MG PO TABS
10 | Freq: Two times a day (BID) | ORAL | Status: DC
Start: 2023-06-07 — End: 2023-06-10
  Administered 2023-06-08 – 2023-06-10 (×6): 10 mg via ORAL

## 2023-06-07 MED ORDER — OXYCODONE HCL 5 MG PO TABS
5 | ORAL | Status: DC | PRN
Start: 2023-06-07 — End: 2023-06-10
  Administered 2023-06-08 – 2023-06-10 (×14): 10 mg via ORAL

## 2023-06-07 MED ORDER — ONDANSETRON HCL 4 MG/2ML IJ SOLN
4 | Freq: Once | INTRAMUSCULAR | Status: DC | PRN
Start: 2023-06-07 — End: 2023-06-07

## 2023-06-07 MED ORDER — PHENYLEPHRINE HCL (PRESSORS) 10 MG/ML IV SOLN
10 | Freq: Once | INTRAVENOUS | Status: DC | PRN
Start: 2023-06-07 — End: 2023-06-07
  Administered 2023-06-07: 21:00:00 60 via INTRAVENOUS
  Administered 2023-06-07: 17:00:00 100 via INTRAVENOUS
  Administered 2023-06-07: 21:00:00 60 via INTRAVENOUS

## 2023-06-07 MED ORDER — DIPHENHYDRAMINE HCL 50 MG/ML IJ SOLN
50 | Freq: Once | INTRAMUSCULAR | Status: DC | PRN
Start: 2023-06-07 — End: 2023-06-07
  Administered 2023-06-07: 16:00:00 12.5 via INTRAVENOUS

## 2023-06-07 MED ORDER — SODIUM CHLORIDE BACTERIOSTATIC 0.9 % IJ SOLN
0.9 | INTRAMUSCULAR | Status: AC
Start: 2023-06-07 — End: ?

## 2023-06-07 MED ORDER — BUDESONIDE-FORMOTEROL FUMARATE 80-4.5 MCG/ACT IN AERO
Freq: Two times a day (BID) | RESPIRATORY_TRACT | Status: DC
Start: 2023-06-07 — End: 2023-06-07

## 2023-06-07 MED ORDER — THROMBIN 5000 UNITS EX SOLR
5000 | CUTANEOUS | Status: AC
Start: 2023-06-07 — End: ?

## 2023-06-07 MED ORDER — VANCOMYCIN HCL 1 G IV SOLR
1 | INTRAVENOUS | Status: AC
Start: 2023-06-07 — End: ?

## 2023-06-07 MED ORDER — ONDANSETRON HCL 4 MG/2ML IJ SOLN
4 | INTRAMUSCULAR | Status: AC
Start: 2023-06-07 — End: ?

## 2023-06-07 MED ORDER — SODIUM CHLORIDE 0.9 % IV SOLN
0.9 | INTRAVENOUS | Status: DC | PRN
Start: 2023-06-07 — End: 2023-06-07
  Administered 2023-06-07: 17:00:00 35 via INTRAVENOUS

## 2023-06-07 MED ORDER — SUGAMMADEX SODIUM 200 MG/2ML IV SOLN
200 | INTRAVENOUS | Status: AC
Start: 2023-06-07 — End: ?

## 2023-06-07 MED ORDER — AZELASTINE-FLUTICASONE 137-50 MCG/ACT NA SUSP
137-50 | Freq: Two times a day (BID) | NASAL | Status: DC
Start: 2023-06-07 — End: 2023-06-10
  Administered 2023-06-10: 02:00:00 1 via NASAL

## 2023-06-07 MED ORDER — HYDROMORPHONE HCL 1 MG/ML IJ SOLN
1 | Freq: Once | INTRAMUSCULAR | Status: DC | PRN
Start: 2023-06-07 — End: 2023-06-07
  Administered 2023-06-07: 21:00:00 .1 via INTRAVENOUS
  Administered 2023-06-07: 21:00:00 .2 via INTRAVENOUS

## 2023-06-07 MED ORDER — OXYCODONE HCL 5 MG PO TABS
5 | ORAL | Status: DC | PRN
Start: 2023-06-07 — End: 2023-06-10

## 2023-06-07 MED ORDER — MAGNESIUM HYDROXIDE 400 MG/5ML PO SUSP
4005 MG/5ML | Freq: Every day | ORAL | Status: AC | PRN
Start: 2023-06-07 — End: 2023-06-10
  Administered 2023-06-09: 22:00:00 30 mL via ORAL

## 2023-06-07 MED ORDER — ONDANSETRON 4 MG PO TBDP
4 | Freq: Three times a day (TID) | ORAL | Status: DC | PRN
Start: 2023-06-07 — End: 2023-06-10

## 2023-06-07 MED ORDER — IPRATROPIUM-ALBUTEROL 0.5-2.5 (3) MG/3ML IN SOLN
RESPIRATORY_TRACT | Status: AC
Start: 2023-06-07 — End: 2023-06-07
  Administered 2023-06-07: 23:00:00 1 via RESPIRATORY_TRACT

## 2023-06-07 MED ORDER — NORMAL SALINE FLUSH 0.9 % IV SOLN
0.9 | INTRAVENOUS | Status: DC | PRN
Start: 2023-06-07 — End: 2023-06-07

## 2023-06-07 MED ORDER — HYDROMORPHONE HCL 1 MG/ML IJ SOLN
1 | INTRAMUSCULAR | Status: DC | PRN
Start: 2023-06-07 — End: 2023-06-08
  Administered 2023-06-08: 03:00:00 0.25 mg via INTRAVENOUS

## 2023-06-07 MED ORDER — SODIUM CHLORIDE 0.9 % IV SOLN
0.9 | INTRAVENOUS | Status: DC | PRN
Start: 2023-06-07 — End: 2023-06-07
  Administered 2023-06-07: 17:00:00 via INTRAVENOUS

## 2023-06-07 MED ORDER — SODIUM CHLORIDE 0.9 % IV SOLN
0.9 | INTRAVENOUS | Status: DC | PRN
Start: 2023-06-07 — End: 2023-06-07
  Administered 2023-06-07: 16:00:00 via INTRAVENOUS

## 2023-06-07 MED ORDER — HYDROMORPHONE HCL 1 MG/ML IJ SOLN
1 | INTRAMUSCULAR | Status: DC | PRN
Start: 2023-06-07 — End: 2023-06-08

## 2023-06-07 MED ORDER — NORMAL SALINE FLUSH 0.9 % IV SOLN
0.9 | INTRAVENOUS | Status: DC | PRN
Start: 2023-06-07 — End: 2023-06-10
  Administered 2023-06-08: 01:00:00 10 mL via INTRAVENOUS

## 2023-06-07 MED ORDER — TAMSULOSIN HCL 0.4 MG PO CAPS
0.4 | Freq: Every day | ORAL | Status: DC
Start: 2023-06-07 — End: 2023-06-10
  Administered 2023-06-08 – 2023-06-10 (×4): 0.4 mg via ORAL

## 2023-06-07 MED ORDER — VANCOMYCIN HCL 1 G IV SOLR
1 | INTRAVENOUS | Status: DC | PRN
Start: 2023-06-07 — End: 2023-06-07
  Administered 2023-06-07: 21:00:00 1000 via TOPICAL

## 2023-06-07 MED ORDER — DIPHENHYDRAMINE HCL 50 MG/ML IJ SOLN
50 | INTRAMUSCULAR | Status: AC
Start: 2023-06-07 — End: ?

## 2023-06-07 MED ORDER — CEFAZOLIN SODIUM 1 G IJ SOLR
1 | INTRAMUSCULAR | Status: AC
Start: 2023-06-07 — End: ?

## 2023-06-07 MED ORDER — FENTANYL CITRATE (PF) 100 MCG/2ML IJ SOLN
100 | INTRAMUSCULAR | Status: DC | PRN
Start: 2023-06-07 — End: 2023-06-07
  Administered 2023-06-07 (×4): 25 ug via INTRAVENOUS

## 2023-06-07 MED ORDER — ROCURONIUM BROMIDE 50 MG/5ML IV SOLN
50 | INTRAVENOUS | Status: AC
Start: 2023-06-07 — End: ?

## 2023-06-07 MED ORDER — PROPOFOL 1000 MG/100ML IV EMUL
1000 | INTRAVENOUS | Status: AC
Start: 2023-06-07 — End: ?

## 2023-06-07 MED ORDER — KETAMINE HCL 50 MG/ML IV SOSY
50 | INTRAVENOUS | Status: AC
Start: 2023-06-07 — End: ?

## 2023-06-07 MED ORDER — REMIFENTANIL HCL 1 MG IV SOLR
1 | INTRAVENOUS | Status: AC
Start: 2023-06-07 — End: ?

## 2023-06-07 MED ORDER — BISACODYL 10 MG RE SUPP
10 | Freq: Every day | RECTAL | Status: DC | PRN
Start: 2023-06-07 — End: 2023-06-10

## 2023-06-07 MED ORDER — ATORVASTATIN CALCIUM 10 MG PO TABS
10 MG | Freq: Every evening | ORAL | Status: AC
Start: 2023-06-07 — End: 2023-06-10
  Administered 2023-06-08 – 2023-06-10 (×3): 20 mg via ORAL

## 2023-06-07 MED ORDER — ACETAMINOPHEN 10 MG/ML IV SOLN
10 | Freq: Three times a day (TID) | INTRAVENOUS | Status: AC
Start: 2023-06-07 — End: 2023-06-08
  Administered 2023-06-07 – 2023-06-08 (×3): 1000 mg via INTRAVENOUS

## 2023-06-07 MED ORDER — FENTANYL CITRATE (PF) 100 MCG/2ML IJ SOLN
100 | Freq: Once | INTRAMUSCULAR | Status: DC | PRN
Start: 2023-06-07 — End: 2023-06-07
  Administered 2023-06-07 (×2): 50 via INTRAVENOUS

## 2023-06-07 MED ORDER — ACETAMINOPHEN 10 MG/ML IV SOLN
10 | Freq: Once | INTRAVENOUS | Status: DC | PRN
Start: 2023-06-07 — End: 2023-06-07
  Administered 2023-06-07: 17:00:00 1000 via INTRAVENOUS

## 2023-06-07 MED ORDER — NALOXONE HCL 0.4 MG/ML IJ SOLN
0.4 MG/ML | INTRAMUSCULAR | Status: DC | PRN
Start: 2023-06-07 — End: 2023-06-07

## 2023-06-07 MED ORDER — KETOROLAC TROMETHAMINE 30 MG/ML IJ SOLN
30 | Freq: Once | INTRAMUSCULAR | Status: DC | PRN
Start: 2023-06-07 — End: 2023-06-07
  Administered 2023-06-07: 21:00:00 30 via INTRAVENOUS

## 2023-06-07 MED ORDER — MAGNESIUM SULFATE IN D5W 1-5 GM/100ML-% IV SOLN
1-5 | INTRAVENOUS | Status: AC
Start: 2023-06-07 — End: ?

## 2023-06-07 MED ORDER — CEFAZOLIN SODIUM-DEXTROSE 2-4 GM/100ML-% IV SOLN
2-4 | INTRAVENOUS | Status: AC
Start: 2023-06-07 — End: 2023-06-07

## 2023-06-07 MED ORDER — POLYETHYLENE GLYCOL 3350 17 G PO PACK
17 | Freq: Every day | ORAL | Status: DC
Start: 2023-06-07 — End: 2023-06-10
  Administered 2023-06-08 – 2023-06-10 (×3): 17 g via ORAL

## 2023-06-07 MED ORDER — GLYCOPYRROLATE 0.2 MG/ML IJ SOLN
0.2 | INTRAMUSCULAR | Status: AC
Start: 2023-06-07 — End: ?

## 2023-06-07 MED ORDER — SODIUM CHLORIDE 0.9 % IV SOLN
0.9 | INTRAVENOUS | Status: DC | PRN
Start: 2023-06-07 — End: 2023-06-10

## 2023-06-07 MED ORDER — METHYLPREDNISOLONE ACETATE 40 MG/ML IJ SUSP
40 | INTRAMUSCULAR | Status: AC
Start: 2023-06-07 — End: ?

## 2023-06-07 MED ORDER — HYDROMORPHONE HCL 2 MG/ML IJ SOLN
2 | INTRAMUSCULAR | Status: DC | PRN
Start: 2023-06-07 — End: 2023-06-07
  Administered 2023-06-07 (×2): 0.5 mg via INTRAVENOUS

## 2023-06-07 MED ORDER — GABAPENTIN 300 MG PO CAPS
300 | Freq: Three times a day (TID) | ORAL | Status: DC
Start: 2023-06-07 — End: 2023-06-10
  Administered 2023-06-08 – 2023-06-10 (×8): 300 mg via ORAL

## 2023-06-07 MED ORDER — HEPARIN 30000 UNITS IN NS 1000 ML INFUSION PREMIX
INTRAVENOUS | Status: AC
Start: 2023-06-07 — End: ?

## 2023-06-07 MED ORDER — LIDOCAINE-EPINEPHRINE 1 %-1:100000 IJ SOLN
1 | INTRAMUSCULAR | Status: DC | PRN
Start: 2023-06-07 — End: 2023-06-07
  Administered 2023-06-07: 18:00:00 20

## 2023-06-07 MED ORDER — SODIUM CHLORIDE 0.9 % IV SOLN
0.9 | INTRAVENOUS | Status: DC
Start: 2023-06-07 — End: 2023-06-10
  Administered 2023-06-08: via INTRAVENOUS

## 2023-06-07 MED ORDER — DILTIAZEM HCL ER COATED BEADS 180 MG PO CP24
180 | Freq: Every day | ORAL | Status: DC
Start: 2023-06-07 — End: 2023-06-10
  Administered 2023-06-08 – 2023-06-10 (×4): 180 mg via ORAL

## 2023-06-07 MED ORDER — LIDOCAINE HCL (PF) 2 % IJ SOLN
2 | INTRAMUSCULAR | Status: AC
Start: 2023-06-07 — End: ?

## 2023-06-07 MED ORDER — GLYCOPYRROLATE 0.2 MG/ML IJ SOLN
0.2 | Freq: Once | INTRAMUSCULAR | Status: DC | PRN
Start: 2023-06-07 — End: 2023-06-07
  Administered 2023-06-07: 16:00:00 .2 via INTRAVENOUS

## 2023-06-07 MED ORDER — LACTATED RINGERS IV SOLN
INTRAVENOUS | Status: DC | PRN
Start: 2023-06-07 — End: 2023-06-07

## 2023-06-07 MED ORDER — DEXAMETHASONE SODIUM PHOSPHATE 20 MG/5ML IJ SOLN
20 | INTRAMUSCULAR | Status: AC
Start: 2023-06-07 — End: ?

## 2023-06-07 MED ORDER — IPRATROPIUM-ALBUTEROL 0.5-2.5 (3) MG/3ML IN SOLN
Freq: Once | RESPIRATORY_TRACT | Status: AC | PRN
Start: 2023-06-07 — End: 2023-06-07

## 2023-06-07 MED ORDER — ONDANSETRON HCL 4 MG/2ML IJ SOLN
4 | Freq: Once | INTRAMUSCULAR | Status: AC | PRN
Start: 2023-06-07 — End: 2023-06-07
  Administered 2023-06-07: 22:00:00 4 mg via INTRAVENOUS

## 2023-06-07 MED ORDER — ONDANSETRON HCL 4 MG/2ML IJ SOLN
4 | Freq: Four times a day (QID) | INTRAMUSCULAR | Status: DC | PRN
Start: 2023-06-07 — End: 2023-06-10

## 2023-06-07 MED ORDER — ACETAMINOPHEN 10 MG/ML IV SOLN
10 | INTRAVENOUS | Status: AC
Start: 2023-06-07 — End: ?

## 2023-06-07 MED ORDER — HYDRALAZINE HCL 20 MG/ML IJ SOLN
20 | INTRAMUSCULAR | Status: DC | PRN
Start: 2023-06-07 — End: 2023-06-07

## 2023-06-07 MED ORDER — REMIFENTANIL HCL 1 MG IV SOLR
1 | INTRAVENOUS | Status: DC | PRN
Start: 2023-06-07 — End: 2023-06-07
  Administered 2023-06-07: 17:00:00 .1 via INTRAVENOUS

## 2023-06-07 MED ORDER — HYDROMORPHONE 1 MG/ML INJ (NDG ONLY)
1 MG/ML | INTRAMUSCULAR | Status: AC
Start: 2023-06-07 — End: ?

## 2023-06-07 MED ORDER — ESMOLOL HCL 100 MG/10ML IV SOLN
100 | Freq: Once | INTRAVENOUS | Status: DC | PRN
Start: 2023-06-07 — End: 2023-06-07
  Administered 2023-06-07: 17:00:00 20 via INTRAVENOUS

## 2023-06-07 MED ORDER — FENTANYL CITRATE (PF) 100 MCG/2ML IJ SOLN
100 | INTRAMUSCULAR | Status: AC
Start: 2023-06-07 — End: ?

## 2023-06-07 MED ORDER — CEFAZOLIN SODIUM 1 G IJ SOLR
1 g | INTRAMUSCULAR | Status: DC | PRN
Start: 2023-06-07 — End: 2023-06-07
  Administered 2023-06-07 (×2): 1000

## 2023-06-07 MED ORDER — PROPOFOL 1000 MG/100ML IV EMUL
1000 | INTRAVENOUS | Status: DC | PRN
Start: 2023-06-07 — End: 2023-06-07
  Administered 2023-06-07: 17:00:00 115 via INTRAVENOUS

## 2023-06-07 MED ORDER — BACITRACIN 500 UNIT/GM EX OINT
500 | CUTANEOUS | Status: AC
Start: 2023-06-07 — End: ?

## 2023-06-07 MED ORDER — CEFAZOLIN SODIUM-DEXTROSE 2-4 GM/100ML-% IV SOLN
2-4 | Freq: Three times a day (TID) | INTRAVENOUS | Status: DC
Start: 2023-06-07 — End: 2023-06-09
  Administered 2023-06-08 – 2023-06-09 (×5): 2000 mg via INTRAVENOUS

## 2023-06-07 MED ORDER — NORMAL SALINE FLUSH 0.9 % IV SOLN
0.9 | Freq: Two times a day (BID) | INTRAVENOUS | Status: DC
Start: 2023-06-07 — End: 2023-06-10
  Administered 2023-06-08 – 2023-06-10 (×6): 10 mL via INTRAVENOUS

## 2023-06-07 MED ORDER — THROMBIN (RECOMBINANT) 5000 UNITS EX SOLR
5000 | CUTANEOUS | Status: DC | PRN
Start: 2023-06-07 — End: 2023-06-07
  Administered 2023-06-07: 18:00:00 9 via TOPICAL

## 2023-06-07 MED ORDER — ESMOLOL HCL 100 MG/10ML IV SOLN
100 | INTRAVENOUS | Status: AC
Start: 2023-06-07 — End: ?

## 2023-06-07 MED ORDER — PROPOFOL 200 MG/20ML IV EMUL
200 | Freq: Once | INTRAVENOUS | Status: DC | PRN
Start: 2023-06-07 — End: 2023-06-07
  Administered 2023-06-07: 16:00:00 150 via INTRAVENOUS
  Administered 2023-06-07: 17:00:00 50 via INTRAVENOUS

## 2023-06-07 MED ORDER — ROCURONIUM BROMIDE 50 MG/5ML IV SOLN
50 | Freq: Once | INTRAVENOUS | Status: DC | PRN
Start: 2023-06-07 — End: 2023-06-07
  Administered 2023-06-07: 18:00:00 30 via INTRAVENOUS
  Administered 2023-06-07: 20:00:00 10 via INTRAVENOUS
  Administered 2023-06-07: 16:00:00 50 via INTRAVENOUS
  Administered 2023-06-07: 19:00:00 20 via INTRAVENOUS
  Administered 2023-06-07 (×2): 10 via INTRAVENOUS

## 2023-06-07 MED ORDER — AMPHETAMINE-DEXTROAMPHETAMINE 10 MG PO TABS
10 | Freq: Two times a day (BID) | ORAL | Status: DC
Start: 2023-06-07 — End: 2023-06-10
  Administered 2023-06-08 – 2023-06-10 (×5): 20 mg via ORAL

## 2023-06-07 MED ORDER — NORMAL SALINE FLUSH 0.9 % IV SOLN
0.9 | Freq: Two times a day (BID) | INTRAVENOUS | Status: DC
Start: 2023-06-07 — End: 2023-06-07

## 2023-06-07 MED ORDER — MAGNESIUM SULFATE IN D5W 1-5 GM/100ML-% IV SOLN
1-5 | Freq: Once | INTRAVENOUS | Status: DC | PRN
Start: 2023-06-07 — End: 2023-06-07
  Administered 2023-06-07: 17:00:00 1000 via INTRAVENOUS

## 2023-06-07 MED ORDER — ROPINIROLE HCL 1 MG PO TABS
1 | Freq: Every evening | ORAL | Status: DC
Start: 2023-06-07 — End: 2023-06-10
  Administered 2023-06-08 – 2023-06-10 (×3): 1 mg via ORAL

## 2023-06-07 MED ORDER — KETAMINE HCL 50 MG/ML IV SOSY
50 | Freq: Once | INTRAVENOUS | Status: DC | PRN
Start: 2023-06-07 — End: 2023-06-07
  Administered 2023-06-07 (×2): 25 via INTRAVENOUS

## 2023-06-07 MED ORDER — DEXMEDETOMIDINE HCL 200 MCG/2ML IV SOLN
200 | Freq: Once | INTRAVENOUS | Status: DC | PRN
Start: 2023-06-07 — End: 2023-06-07
  Administered 2023-06-07: 20:00:00 8 via INTRAVENOUS

## 2023-06-07 MED ORDER — VITAMIN D 25 MCG (1000 UT) PO TABS
25 | Freq: Every day | ORAL | Status: DC
Start: 2023-06-07 — End: 2023-06-10
  Administered 2023-06-08 – 2023-06-10 (×4): 5000 [IU] via ORAL

## 2023-06-07 MED ORDER — ONDANSETRON HCL 4 MG/2ML IJ SOLN
4 | Freq: Once | INTRAMUSCULAR | Status: DC | PRN
Start: 2023-06-07 — End: 2023-06-07
  Administered 2023-06-07: 21:00:00 4 via INTRAVENOUS

## 2023-06-07 MED ORDER — LACTATED RINGERS IV SOLN
INTRAVENOUS | Status: DC
Start: 2023-06-07 — End: 2023-06-07

## 2023-06-07 MED FILL — HYDROMORPHONE HCL 2 MG/ML IJ SOLN: 2 MG/ML | INTRAMUSCULAR | Qty: 1

## 2023-06-07 MED FILL — GLYCOPYRROLATE 0.2 MG/ML IJ SOLN: 0.2 MG/ML | INTRAMUSCULAR | Qty: 1

## 2023-06-07 MED FILL — LACTATED RINGERS IV SOLN: INTRAVENOUS | Qty: 1000

## 2023-06-07 MED FILL — VANCOMYCIN HCL 1 G IV SOLR: 1 g | INTRAVENOUS | Qty: 1000

## 2023-06-07 MED FILL — CEFAZOLIN SODIUM 1 G IJ SOLR: 1 g | INTRAMUSCULAR | Qty: 2000

## 2023-06-07 MED FILL — PROAIR HFA 108 (90 BASE) MCG/ACT IN AERS: 108 (90 Base) MCG/ACT | RESPIRATORY_TRACT | Qty: 8.5

## 2023-06-07 MED FILL — IPRATROPIUM-ALBUTEROL 0.5-2.5 (3) MG/3ML IN SOLN: 0.5-2.5 (3) MG/3ML | RESPIRATORY_TRACT | Qty: 3

## 2023-06-07 MED FILL — HEPARIN 30000 UNITS IN NS 1000 ML INFUSION PREMIX: 30000-0.9 UNIT/L-% | INTRAVENOUS | Qty: 1000

## 2023-06-07 MED FILL — MAGNESIUM SULFATE IN D5W 1-5 GM/100ML-% IV SOLN: 1-5 GM/100ML-% | INTRAVENOUS | Qty: 100

## 2023-06-07 MED FILL — KETOROLAC TROMETHAMINE 30 MG/ML IJ SOLN: 30 MG/ML | INTRAMUSCULAR | Qty: 1

## 2023-06-07 MED FILL — ROCURONIUM BROMIDE 50 MG/5ML IV SOLN: 50 MG/5ML | INTRAVENOUS | Qty: 5

## 2023-06-07 MED FILL — THROMBIN-JMI 5000 UNITS EX SOLR: 5000 units | CUTANEOUS | Qty: 30000

## 2023-06-07 MED FILL — ACETAMINOPHEN 10 MG/ML IV SOLN: 10 MG/ML | INTRAVENOUS | Qty: 200

## 2023-06-07 MED FILL — SODIUM CHLORIDE 0.9 % IV SOLN: 0.9 % | INTRAVENOUS | Qty: 1000

## 2023-06-07 MED FILL — BRIDION 200 MG/2ML IV SOLN: 200 MG/2ML | INTRAVENOUS | Qty: 2

## 2023-06-07 MED FILL — KETAMINE HCL 50 MG/ML IV SOSY: 50 MG/ML | INTRAVENOUS | Qty: 1

## 2023-06-07 MED FILL — XYLOCAINE/EPINEPHRINE 1 %-1:100000 IJ SOLN: 1 %-:00000 | INTRAMUSCULAR | Qty: 20

## 2023-06-07 MED FILL — BACITRACIN 500 UNIT/GM EX OINT: 500 UNIT/GM | CUTANEOUS | Qty: 14

## 2023-06-07 MED FILL — ULTIVA 1 MG IV SOLR: 1 MG | INTRAVENOUS | Qty: 2000

## 2023-06-07 MED FILL — FENTANYL CITRATE (PF) 100 MCG/2ML IJ SOLN: 100 MCG/2ML | INTRAMUSCULAR | Qty: 2

## 2023-06-07 MED FILL — ACETAMINOPHEN 10 MG/ML IV SOLN: 10 MG/ML | INTRAVENOUS | Qty: 100

## 2023-06-07 MED FILL — BREVIBLOC 100 MG/10ML IV SOLN: 100 MG/10ML | INTRAVENOUS | Qty: 10

## 2023-06-07 MED FILL — HYDROMORPHONE HCL 1 MG/ML IJ SOLN: 1 MG/ML | INTRAMUSCULAR | Qty: 1

## 2023-06-07 MED FILL — THROMBIN-JMI 5000 UNITS EX SOLR: 5000 units | CUTANEOUS | Qty: 15000

## 2023-06-07 MED FILL — DEXAMETHASONE SODIUM PHOSPHATE 10 MG/ML IJ SOLN: 10 MG/ML | INTRAMUSCULAR | Qty: 1

## 2023-06-07 MED FILL — THROMBIN-JMI 5000 UNITS EX SOLR: 5000 units | CUTANEOUS | Qty: 5000

## 2023-06-07 MED FILL — MIDAZOLAM HCL 2 MG/2ML IJ SOLN: 2 MG/ML | INTRAMUSCULAR | Qty: 2

## 2023-06-07 MED FILL — PROPOFOL 1000 MG/100ML IV EMUL: 1000 MG/100ML | INTRAVENOUS | Qty: 200

## 2023-06-07 MED FILL — DEPO-MEDROL 40 MG/ML IJ SUSP: 40 MG/ML | INTRAMUSCULAR | Qty: 1

## 2023-06-07 MED FILL — DIPHENHYDRAMINE HCL 50 MG/ML IJ SOLN: 50 MG/ML | INTRAMUSCULAR | Qty: 1

## 2023-06-07 MED FILL — SODIUM CHLORIDE BACTERIOSTATIC 0.9 % IJ SOLN: 0.9 % | INTRAMUSCULAR | Qty: 120

## 2023-06-07 MED FILL — ONDANSETRON HCL 4 MG/2ML IJ SOLN: 4 MG/2ML | INTRAMUSCULAR | Qty: 2

## 2023-06-07 MED FILL — DEXAMETHASONE SODIUM PHOSPHATE 20 MG/5ML IJ SOLN: 20 MG/5ML | INTRAMUSCULAR | Qty: 5

## 2023-06-07 MED FILL — XYLOCAINE-MPF 2 % IJ SOLN: 2 % | INTRAMUSCULAR | Qty: 5

## 2023-06-07 NOTE — Op Note (Addendum)
 Operative Note      Patient: [name]  Date of Birth: [birth year]  MRN: [id number]    Date of Procedure: 06/07/2023    Pre-op:  Nonunion Type II odontoid fracture    Post-Op Diagnosis: Same       Procedure(s):  LAMINECTOMY C1-C2; SPINE CORD DECOMPRESSION; A

## 2023-06-07 NOTE — Progress Notes (Signed)
 TRANSFER - OUT REPORT:    Verbal report given to [employer], RN on [name]  being transferred to 2113 for routine progression of patient care       Report consisted of patient's Situation, Background, Assessment and   Recommendations(SBAR).     [employer]

## 2023-06-07 NOTE — Other (Signed)
 Pre-Operative Patient Rounding and Family Updates        [x]     Hourly rounding    [x]     Offered toileting    [x]     Call bell with in reach    Update given to:       [x]    Patient                [x]    Family     Update related to:

## 2023-06-07 NOTE — Brief Op Note (Signed)
 Brief Postoperative Note      Patient: [name]  Date of Birth: 1961/06/21  MRN: [id number]    Date of Procedure: 06/07/2023    Pre-op:  Nonunion Type II odontoid fracture    Post-Op Diagnosis: Same       Procedure:  C1-2 laminectomy,spinal cord decompress

## 2023-06-07 NOTE — Progress Notes (Signed)
 [name] [phone]

## 2023-06-07 NOTE — Anesthesia Pre-Procedure Evaluation (Signed)
 Department of Anesthesiology  Preprocedure Note       Name:  [name]   Age:  62 y.o.  DOB:  [birth year]                                          MRN:  [id number]         Date:  06/07/2023      Surgeon: Moishe Spice):  Alfonso Patten, MD    Procedure: Demetrius Charity

## 2023-06-07 NOTE — Progress Notes (Signed)
 Neurosurgery Post-op check    Doing well,c/o neck pain/incisional pain  Reports return of strength in hands    Neuro:  5/5  Sensory intact LT  [place] postop care

## 2023-06-07 NOTE — Anesthesia Post-Procedure Evaluation (Signed)
 Department of Anesthesiology  Postprocedure Note    Patient: [name]  MRN: [id number]  Birthdate: [birth year]  Date of evaluation: 06/07/2023    Procedure Summary       Date: 06/07/23 Room / Location: [facility] MAIN 08 / [facility] MAIN OR    Anesthesia Start: 1116 An

## 2023-06-07 NOTE — H&P (Signed)
 History and Physical reviewed. I have examined the patient and there are no pertinent changes.

## 2023-06-08 LAB — CBC
Hematocrit: 36.8 % (ref 35.0–47.0)
Hemoglobin: 12.1 g/dL (ref 11.0–16.0)
MCH: 32.7 pg (ref 25.4–34.6)
MCHC: 32.9 g/dL (ref 30.0–36.0)
MCV: 99.5 fL — ABNORMAL HIGH (ref 80.0–98.0)
MPV: 9.8 fL (ref 6.0–10.0)
Platelets: 258 10*3/uL (ref 140–450)
RBC: 3.7 M/uL (ref 3.60–5.20)
RDW: 53.2 — ABNORMAL HIGH (ref 36.4–46.3)
WBC: 15 10*3/uL — ABNORMAL HIGH (ref 4.0–11.0)

## 2023-06-08 LAB — BASIC METABOLIC PANEL
Anion Gap: 6 mmol/L (ref 5–15)
BUN: 10 mg/dL (ref 9–23)
CO2: 25 meq/L (ref 20–31)
Calcium: 9.2 mg/dL (ref 8.7–10.4)
Chloride: 108 meq/L — ABNORMAL HIGH (ref 98–107)
Creatinine: 0.66 mg/dL (ref 0.55–1.02)
GFR African American: >60
GFR Non-African American: >60
Glucose: 144 mg/dL — ABNORMAL HIGH (ref 74–106)
Potassium: 3.9 meq/L (ref 3.5–5.1)
Sodium: 139 meq/L (ref 136–145)

## 2023-06-08 MED ORDER — FLUTICASONE FUROATE-VILANTEROL 100-25 MCG/ACT IN AEPB
100-25 | Freq: Every day | RESPIRATORY_TRACT | Status: DC
Start: 2023-06-08 — End: 2023-06-10
  Administered 2023-06-08 – 2023-06-10 (×3): 1 via RESPIRATORY_TRACT

## 2023-06-08 MED ORDER — HYDROMORPHONE HCL 2 MG/ML IJ SOLN
2 | INTRAMUSCULAR | Status: DC | PRN
Start: 2023-06-08 — End: 2023-06-10

## 2023-06-08 MED ORDER — HYDROMORPHONE HCL 2 MG/ML IJ SOLN
2 | INTRAMUSCULAR | Status: DC | PRN
Start: 2023-06-08 — End: 2023-06-10
  Administered 2023-06-08: 12:00:00 0.5 mg via INTRAVENOUS

## 2023-06-08 MED FILL — VITAMIN D3 25 MCG (1000 UT) PO TABS: 25 MCG (1000 UT) | ORAL | Qty: 5

## 2023-06-08 MED FILL — OXYCODONE HCL 5 MG PO TABS: 5 MG | ORAL | Qty: 2

## 2023-06-08 MED FILL — VITAMIN D3 25 MCG (1000 UT) PO TABS: 25 MCG (1000 UT) | ORAL | Qty: 2

## 2023-06-08 MED FILL — POLYETHYLENE GLYCOL 3350 17 G PO PACK: 17 g | ORAL | Qty: 1

## 2023-06-08 MED FILL — CEFAZOLIN SODIUM-DEXTROSE 2-4 GM/100ML-% IV SOLN: 2-4 GM/100ML-% | INTRAVENOUS | Qty: 100

## 2023-06-08 MED FILL — DILTIAZEM HCL ER COATED BEADS 180 MG PO CP24: 180 MG | ORAL | Qty: 1

## 2023-06-08 MED FILL — GABAPENTIN 300 MG PO CAPS: 300 MG | ORAL | Qty: 1

## 2023-06-08 MED FILL — SENNOSIDES-DOCUSATE SODIUM 8.6-50 MG PO TABS: 8.6-50 MG | ORAL | Qty: 1

## 2023-06-08 MED FILL — PANTOPRAZOLE SODIUM 40 MG PO TBEC: 40 MG | ORAL | Qty: 1

## 2023-06-08 MED FILL — HYDROMORPHONE HCL 1 MG/ML IJ SOLN: 1 MG/ML | INTRAMUSCULAR | Qty: 1

## 2023-06-08 MED FILL — AMPHETAMINE-DEXTROAMPHETAMINE 10 MG PO TABS: 10 MG | ORAL | Qty: 2

## 2023-06-08 MED FILL — ROPINIROLE HCL 1 MG PO TABS: 1 MG | ORAL | Qty: 1

## 2023-06-08 MED FILL — SODIUM CHLORIDE FLUSH 0.9 % IV SOLN: 0.9 % | INTRAVENOUS | Qty: 10

## 2023-06-08 MED FILL — BACLOFEN 10 MG PO TABS: 10 MG | ORAL | Qty: 1

## 2023-06-08 MED FILL — ACETAMINOPHEN 10 MG/ML IV SOLN: 10 MG/ML | INTRAVENOUS | Qty: 100

## 2023-06-08 MED FILL — HYDROMORPHONE HCL 2 MG/ML IJ SOLN: 2 MG/ML | INTRAMUSCULAR | Qty: 1 | Fill #0

## 2023-06-08 MED FILL — DEXAMETHASONE SODIUM PHOSPHATE 10 MG/ML IJ SOLN: 10 MG/ML | INTRAMUSCULAR | Qty: 1

## 2023-06-08 MED FILL — BREO ELLIPTA 100-25 MCG/ACT IN AEPB: 100-25 MCG/ACT | RESPIRATORY_TRACT | Qty: 28

## 2023-06-08 MED FILL — TAMSULOSIN HCL 0.4 MG PO CAPS: 0.4 MG | ORAL | Qty: 1

## 2023-06-08 MED FILL — ATORVASTATIN CALCIUM 10 MG PO TABS: 10 MG | ORAL | Qty: 2

## 2023-06-08 NOTE — Progress Notes (Signed)
 NEUROSURGERY PROGRESS NOTE    Patient: [name] MRN: [id number]  SSN: [id number]    Date of Birth: April 16, 1961  Age: 62 y.o.  Sex: female       SUBJECTIVE: s/p C1-C2 laminectomy and arthrodesis with instrumentation 11/22 for nonunion type II odontoid

## 2023-06-08 NOTE — Care Coordination-Inpatient (Signed)
 [facility]   Care Management Initial Assessment        Home/Support/Situation prior to admission: home with spouse     List DME prior to admission: NONE        Home address, phone number, insurance, emergency contacts verified:  yes

## 2023-06-08 NOTE — Progress Notes (Signed)
 PHYSICAL THERAPY EVALUATION     Acknowledge Orders  Time  PT Charge Capture  Rehab Caseload Tracker  [place] AM-PAC "6 Clicks" Basic Mobility Inpatient Short Form  -    Patient: [name] (62 y.o. female)  Room: 2113/2113    Primary Diagnos

## 2023-06-08 NOTE — Plan of Care (Signed)
 Problem: Pain  Goal: Verbalizes/displays adequate comfort level or baseline comfort level  06/08/2023 0342 by Evonnie Dawes, RN  Outcome: Progressing  06/07/2023 1833 by Nino Glow, RN  Outcome: Progressing     Problem: ABCDS Injury Assessme

## 2023-06-09 LAB — BASIC METABOLIC PANEL
Anion Gap: 6 mmol/L (ref 5–15)
BUN: 11 mg/dL (ref 9–23)
CO2: 27 meq/L (ref 20–31)
Calcium: 9.3 mg/dL (ref 8.7–10.4)
Chloride: 108 meq/L — ABNORMAL HIGH (ref 98–107)
Creatinine: 0.79 mg/dL (ref 0.55–1.02)
GFR African American: >60
GFR Non-African American: >60
Glucose: 136 mg/dL — ABNORMAL HIGH (ref 74–106)
Potassium: 4.2 meq/L (ref 3.5–5.1)
Sodium: 141 meq/L (ref 136–145)

## 2023-06-09 LAB — CBC
Hematocrit: 37 % (ref 35.0–47.0)
Hemoglobin: 12 g/dL (ref 11.0–16.0)
MCH: 33.1 pg (ref 25.4–34.6)
MCHC: 32.4 g/dL (ref 30.0–36.0)
MCV: 101.9 fL — ABNORMAL HIGH (ref 80.0–98.0)
MPV: 10.3 fL — ABNORMAL HIGH (ref 6.0–10.0)
Platelets: 280 10*3/uL (ref 140–450)
RBC: 3.63 M/uL (ref 3.60–5.20)
RDW: 55.8 — ABNORMAL HIGH (ref 36.4–46.3)
WBC: 18.2 10*3/uL — ABNORMAL HIGH (ref 4.0–11.0)

## 2023-06-09 MED ORDER — KETOROLAC TROMETHAMINE 15 MG/ML IJ SOLN
15 | Freq: Four times a day (QID) | INTRAMUSCULAR | Status: AC
Start: 2023-06-09 — End: 2023-06-10
  Administered 2023-06-09 – 2023-06-10 (×4): 30 mg via INTRAVENOUS

## 2023-06-09 MED FILL — POLYETHYLENE GLYCOL 3350 17 G PO PACK: 17 g | ORAL | Qty: 1

## 2023-06-09 MED FILL — CEFAZOLIN SODIUM-DEXTROSE 2-4 GM/100ML-% IV SOLN: 2-4 GM/100ML-% | INTRAVENOUS | Qty: 100

## 2023-06-09 MED FILL — TAMSULOSIN HCL 0.4 MG PO CAPS: 0.4 MG | ORAL | Qty: 1

## 2023-06-09 MED FILL — ATORVASTATIN CALCIUM 10 MG PO TABS: 10 MG | ORAL | Qty: 2

## 2023-06-09 MED FILL — GABAPENTIN 300 MG PO CAPS: 300 MG | ORAL | Qty: 1

## 2023-06-09 MED FILL — OXYCODONE HCL 5 MG PO TABS: 5 MG | ORAL | Qty: 2

## 2023-06-09 MED FILL — AMPHETAMINE-DEXTROAMPHETAMINE 10 MG PO TABS: 10 MG | ORAL | Qty: 2

## 2023-06-09 MED FILL — PANTOPRAZOLE SODIUM 40 MG PO TBEC: 40 MG | ORAL | Qty: 1

## 2023-06-09 MED FILL — BACLOFEN 10 MG PO TABS: 10 MG | ORAL | Qty: 1

## 2023-06-09 MED FILL — ROPINIROLE HCL 1 MG PO TABS: 1 MG | ORAL | Qty: 1

## 2023-06-09 MED FILL — KETOROLAC TROMETHAMINE 15 MG/ML IJ SOLN: 15 MG/ML | INTRAMUSCULAR | Qty: 2 | Fill #0

## 2023-06-09 MED FILL — SENNOSIDES-DOCUSATE SODIUM 8.6-50 MG PO TABS: 8.6-50 MG | ORAL | Qty: 1

## 2023-06-09 MED FILL — MILK OF MAGNESIA 7.75 % PO SUSP: 7.75 % | ORAL | Qty: 30

## 2023-06-09 MED FILL — VITAMIN D3 25 MCG (1000 UT) PO TABS: 25 MCG (1000 UT) | ORAL | Qty: 5

## 2023-06-09 MED FILL — DILTIAZEM HCL ER COATED BEADS 180 MG PO CP24: 180 MG | ORAL | Qty: 1

## 2023-06-09 NOTE — Care Coordination-Inpatient (Signed)
 [facility]   Care Management Update    Patient Name: [name]                       Unit/Room: 2113/2113  Admitting Diagnosis: Closed odontoid fracture with type II morphology and posterior displacement, initial encounter (HCC)

## 2023-06-09 NOTE — Plan of Care (Signed)
Problem: Pain  Goal: Verbalizes/displays adequate comfort level or baseline comfort level  Outcome: Progressing

## 2023-06-09 NOTE — Progress Notes (Signed)
 NEUROSURGERY PROGRESS NOTE    Patient: [name] MRN: [id number]  SSN: [id number]    Date of Birth: April 16, 1961  Age: 62 y.o.  Sex: female       SUBJECTIVE: s/p C1-C2 laminectomy and arthrodesis with instrumentation 11/22 for nonunion type II odontoid

## 2023-06-09 NOTE — Progress Notes (Signed)
 PHYSICAL THERAPY TREATMENT:   Time  PT Charge Capture  Rehab Caseload Tracker  T[place]etti    -    Patient: [name] (62 y.o. female)  Room: 2113/2113    Primary Diagnosis: Closed odontoid fracture with type II morphology and posterior displacement, initi

## 2023-06-10 LAB — BASIC METABOLIC PANEL
Anion Gap: 7 mmol/L (ref 5–15)
BUN: 18 mg/dL (ref 9–23)
CO2: 28 meq/L (ref 20–31)
Calcium: 8.9 mg/dL (ref 8.7–10.4)
Chloride: 106 meq/L (ref 98–107)
Creatinine: 0.67 mg/dL (ref 0.55–1.02)
GFR African American: >60
GFR Non-African American: >60
Glucose: 119 mg/dL — ABNORMAL HIGH (ref 74–106)
Potassium: 3.8 meq/L (ref 3.5–5.1)
Sodium: 141 meq/L (ref 136–145)

## 2023-06-10 LAB — CBC
Hematocrit: 33.5 % — ABNORMAL LOW (ref 35.0–47.0)
Hemoglobin: 10.8 g/dL — ABNORMAL LOW (ref 11.0–16.0)
MCH: 32.6 pg (ref 25.4–34.6)
MCHC: 32.2 g/dL (ref 30.0–36.0)
MCV: 101.2 fL — ABNORMAL HIGH (ref 80.0–98.0)
MPV: 10.3 fL — ABNORMAL HIGH (ref 6.0–10.0)
Platelets: 238 10*3/uL (ref 140–450)
RBC: 3.31 M/uL — ABNORMAL LOW (ref 3.60–5.20)
RDW: 54.7 — ABNORMAL HIGH (ref 36.4–46.3)
WBC: 11.4 10*3/uL — ABNORMAL HIGH (ref 4.0–11.0)

## 2023-06-10 MED ORDER — GABAPENTIN 300 MG PO CAPS
300 | ORAL_CAPSULE | Freq: Three times a day (TID) | ORAL | 0 refills | Status: AC
Start: 2023-06-10 — End: 2023-07-10

## 2023-06-10 MED ORDER — OXYCODONE-ACETAMINOPHEN 10-325 MG PO TABS
10-325 | ORAL_TABLET | Freq: Four times a day (QID) | ORAL | 0 refills | Status: AC | PRN
Start: 2023-06-10 — End: 2023-06-17

## 2023-06-10 MED ORDER — BACLOFEN 10 MG PO TABS
10 | ORAL_TABLET | Freq: Two times a day (BID) | ORAL | 0 refills | Status: AC
Start: 2023-06-10 — End: 2023-06-24

## 2023-06-10 MED FILL — SODIUM CHLORIDE FLUSH 0.9 % IV SOLN: 0.9 % | INTRAVENOUS | Qty: 10

## 2023-06-10 MED FILL — AMPHETAMINE-DEXTROAMPHETAMINE 10 MG PO TABS: 10 MG | ORAL | Qty: 2

## 2023-06-10 MED FILL — POLYETHYLENE GLYCOL 3350 17 G PO PACK: 17 g | ORAL | Qty: 1

## 2023-06-10 MED FILL — OXYCODONE HCL 5 MG PO TABS: 5 MG | ORAL | Qty: 2

## 2023-06-10 MED FILL — KETOROLAC TROMETHAMINE 15 MG/ML IJ SOLN: 15 MG/ML | INTRAMUSCULAR | Qty: 2 | Fill #0

## 2023-06-10 MED FILL — SENNOSIDES-DOCUSATE SODIUM 8.6-50 MG PO TABS: ORAL | Qty: 1

## 2023-06-10 MED FILL — GABAPENTIN 300 MG PO CAPS: 300 MG | ORAL | Qty: 1

## 2023-06-10 MED FILL — ROPINIROLE HCL 1 MG PO TABS: 1 MG | ORAL | Qty: 1

## 2023-06-10 MED FILL — KETOROLAC TROMETHAMINE 15 MG/ML IJ SOLN: 15 MG/ML | INTRAMUSCULAR | Qty: 2

## 2023-06-10 MED FILL — VITAMIN D3 25 MCG (1000 UT) PO TABS: 25 MCG (1000 UT) | ORAL | Qty: 5

## 2023-06-10 MED FILL — ATORVASTATIN CALCIUM 10 MG PO TABS: 10 MG | ORAL | Qty: 2

## 2023-06-10 MED FILL — DILTIAZEM HCL ER COATED BEADS 180 MG PO CP24: 180 MG | ORAL | Qty: 1

## 2023-06-10 MED FILL — TAMSULOSIN HCL 0.4 MG PO CAPS: 0.4 MG | ORAL | Qty: 1

## 2023-06-10 MED FILL — SENNOSIDES-DOCUSATE SODIUM 8.6-50 MG PO TABS: 8.6-50 MG | ORAL | Qty: 1

## 2023-06-10 MED FILL — BACLOFEN 10 MG PO TABS: 10 MG | ORAL | Qty: 1

## 2023-06-10 MED FILL — PANTOPRAZOLE SODIUM 40 MG PO TBEC: 40 MG | ORAL | Qty: 1

## 2023-06-10 NOTE — Care Coordination-Inpatient (Signed)
 [facility]                                                                                                 PATIENT INFORMATION   Patient Name: [name], [name] Acct: [id number] Patient MRN: [id number]   Address: [address]

## 2023-06-10 NOTE — Progress Notes (Signed)
 PHYSICAL THERAPY TREATMENT:     Acknowledge Orders  Time  PT Charge Capture  Rehab Caseload Tracker  [employer] AM-PAC "6 Clicks" Basic Mobility Inpatient Short Form  -    Patient: [name] (62 y.o. female)  Room: 2113/2113    Primary Diagnosis

## 2023-06-10 NOTE — Progress Notes (Signed)
 OCCUPATIONAL THERAPY EVALUATION     Patient: [name] (62 y.o. female)  Room: 2113/2113    Primary Diagnosis: Closed odontoid fracture with type II morphology and posterior displacement, initial encounter (HCC) [S12.111A]  Odontoid fracture with t

## 2023-06-10 NOTE — Discharge Summary (Signed)
 Discharge Summary  Admit Date: 06/07/2023  Discharge Date:  06/10/2023      Patient ID:  [name]  62 y.o.  Jul 12, 1961    PCP:  Sonnie Alamo, PA    No chief complaint on file.      Patient Active Problem List    Diagnosis Date Noted    Odontoi

## 2023-06-10 NOTE — Progress Notes (Addendum)
 [facility]   Care Management Discharge Note    Discharge Date:  06/10/2023        Discharge Plan: Home with spouse, OP PT not needed per neurosx as PT isn't started till week 12 per NP.      D/C Medications sent to [facility] OP pharmacy

## 2023-06-10 NOTE — Progress Notes (Addendum)
 [facility]   Care Management Update    Patient Name: [name]                       Unit/Room: 2113/2113  Admitting Diagnosis: Closed odontoid fracture with type II morphology and posterior displacement, initial encounter (HCC)

## 2023-06-10 NOTE — Progress Notes (Signed)
 Chaplain Services  Initial Visit    Start Time: 0845  End Time: [phone]    Volunteer Chaplain conducted an Initial consultation and Spiritual Assessment for [name], who is a 62 y.o.,female.  According to the patient's EMR Religious Affiliation is: None

## 2023-06-26 NOTE — Progress Notes (Signed)
 In review of chart/program spreadsheet, Annual LDCT LCS program order needed for January 2025.     Requested annual renewal by rightfax to last ordering provider.     [name] L. Anniece Bleiler, LPN  [facility] - Lung Cancer Screening Program Coordinator  Phone [phone]-

## 2023-07-12 ENCOUNTER — Encounter: Admit: 2023-07-12 | Admitting: Physician Assistant

## 2023-07-12 DIAGNOSIS — Z87891 Personal history of nicotine dependence: Secondary | ICD-10-CM

## 2023-07-12 NOTE — Progress Notes (Signed)
 07/12/2023    Spoke with patient at [phone], pt just had neck surgery and request to hold on LCS CT until February time frame.    Patient request a call back around end of January / February    Emeline Darling, LPN  [facility]  Lung Canc

## 2023-07-18 ENCOUNTER — Ambulatory Visit
Admit: 2023-07-18 | Discharge: 2023-07-19 | Disposition: A | Discharge status: Home / Self Care | Payer: BLUE CROSS/BLUE SHIELD | Attending: Registered Nurse | Admitting: Registered Nurse | Primary: Physician Assistant

## 2023-07-18 ENCOUNTER — Encounter: Admit: 2023-07-18 | Admitting: Registered Nurse

## 2023-07-18 DIAGNOSIS — S12100A Unspecified displaced fracture of second cervical vertebra, initial encounter for closed fracture: Principal | ICD-10-CM

## 2023-08-21 NOTE — Progress Notes (Signed)
08/21/2023    UPDATE:    Spoke with patient, she is still not ready to schedule now, but agreed to schedule for end of April on a Friday    Pt is scheduled for 11/08/2023

## 2023-09-03 ENCOUNTER — Inpatient Hospital Stay: Admit: 2023-09-03 | Payer: BLUE CROSS/BLUE SHIELD | Primary: Physician Assistant

## 2023-09-03 ENCOUNTER — Encounter

## 2023-09-03 DIAGNOSIS — S12100A Unspecified displaced fracture of second cervical vertebra, initial encounter for closed fracture: Secondary | ICD-10-CM

## 2023-09-09 ENCOUNTER — Encounter

## 2023-09-17 ENCOUNTER — Encounter

## 2023-09-17 ENCOUNTER — Inpatient Hospital Stay: Admit: 2023-09-18 | Payer: BLUE CROSS/BLUE SHIELD | Primary: Physician Assistant

## 2023-10-04 ENCOUNTER — Ambulatory Visit: Payer: BLUE CROSS/BLUE SHIELD | Primary: Physician Assistant

## 2023-10-08 ENCOUNTER — Inpatient Hospital Stay: Admit: 2023-10-24 | Payer: BLUE CROSS/BLUE SHIELD | Primary: Physician Assistant

## 2023-10-08 ENCOUNTER — Other Ambulatory Visit: Payer: Self-pay | Admitting: Physician Assistant

## 2023-10-08 DIAGNOSIS — R140 Abdominal distension (gaseous): Secondary | ICD-10-CM

## 2023-10-08 DIAGNOSIS — E782 Mixed hyperlipidemia: Secondary | ICD-10-CM

## 2023-10-08 DIAGNOSIS — R1012 Left upper quadrant pain: Secondary | ICD-10-CM

## 2023-10-22 ENCOUNTER — Ambulatory Visit
Admission: RE | Admit: 2023-10-22 | Discharge: 2023-10-22 | Disposition: A | Discharge status: Home / Self Care | Source: Ambulatory Visit | Attending: Physician Assistant | Admitting: Physician Assistant

## 2023-10-22 DIAGNOSIS — E782 Mixed hyperlipidemia: Secondary | ICD-10-CM | POA: Diagnosis present

## 2023-10-22 DIAGNOSIS — R1012 Left upper quadrant pain: Secondary | ICD-10-CM

## 2023-10-22 DIAGNOSIS — R140 Abdominal distension (gaseous): Secondary | ICD-10-CM | POA: Diagnosis present

## 2023-10-22 MED ORDER — IOHEXOL 300 MG/ML  SOLN
100.0000 mL | Freq: Once | INTRAMUSCULAR | Status: AC | PRN
  Administered 2023-10-22: 100 mL via INTRAVENOUS

## 2023-10-25 ENCOUNTER — Inpatient Hospital Stay: Admit: 2023-10-25 | Payer: BLUE CROSS/BLUE SHIELD | Primary: Physician Assistant

## 2023-10-25 ENCOUNTER — Encounter

## 2023-10-25 DIAGNOSIS — S12100A Unspecified displaced fracture of second cervical vertebra, initial encounter for closed fracture: Secondary | ICD-10-CM

## 2023-10-25 DIAGNOSIS — M5416 Radiculopathy, lumbar region: Secondary | ICD-10-CM

## 2023-11-08 ENCOUNTER — Ambulatory Visit: Payer: BLUE CROSS/BLUE SHIELD | Primary: Physician Assistant

## 2023-11-08 ENCOUNTER — Inpatient Hospital Stay: Admit: 2023-12-03 | Primary: Physician Assistant

## 2023-11-08 NOTE — Progress Notes (Signed)
 Pt cancelled appt and never rescheduled    Note sent to ordering provider due to multiple attempts since 07/12/2023 to schedule pt        Eligio Grumbling, LPN  [facility]  Lung Cancer Screening Program Coordinator  Phone [phone]  Fax [phone]

## 2023-11-19 ENCOUNTER — Inpatient Hospital Stay: Admit: 2023-11-28 | Primary: Physician Assistant

## 2024-01-13 ENCOUNTER — Encounter

## 2024-01-25 ENCOUNTER — Inpatient Hospital Stay: Payer: BLUE CROSS/BLUE SHIELD | Primary: Physician Assistant

## 2024-02-01 ENCOUNTER — Inpatient Hospital Stay: Payer: BLUE CROSS/BLUE SHIELD | Primary: Physician Assistant

## 2024-02-10 ENCOUNTER — Inpatient Hospital Stay: Admit: 2024-02-12 | Payer: BLUE CROSS/BLUE SHIELD | Primary: Physician Assistant

## 2024-02-13 ENCOUNTER — Other Ambulatory Visit: Payer: Self-pay | Admitting: Physician Assistant

## 2024-02-13 DIAGNOSIS — Z1231 Encounter for screening mammogram for malignant neoplasm of breast: Secondary | ICD-10-CM

## 2024-02-17 ENCOUNTER — Inpatient Hospital Stay: Admit: 2024-02-17 | Payer: BLUE CROSS/BLUE SHIELD | Primary: Physician Assistant

## 2024-02-17 ENCOUNTER — Encounter

## 2024-02-17 DIAGNOSIS — M5412 Radiculopathy, cervical region: Principal | ICD-10-CM

## 2024-03-05 ENCOUNTER — Ambulatory Visit
Admission: RE | Admit: 2024-03-05 | Discharge: 2024-03-05 | Disposition: A | Discharge status: Home / Self Care | Source: Ambulatory Visit | Attending: Physician Assistant | Admitting: Physician Assistant

## 2024-03-05 DIAGNOSIS — Z1231 Encounter for screening mammogram for malignant neoplasm of breast: Secondary | ICD-10-CM | POA: Insufficient documentation

## 2024-03-10 ENCOUNTER — Inpatient Hospital Stay: Admit: 2024-03-10 | Payer: BLUE CROSS/BLUE SHIELD | Primary: Physician Assistant

## 2024-03-19 ENCOUNTER — Inpatient Hospital Stay: Admit: 2024-03-19 | Payer: BLUE CROSS/BLUE SHIELD | Primary: Physician Assistant

## 2024-04-13 ENCOUNTER — Inpatient Hospital Stay: Admit: 2024-04-13 | Payer: BLUE CROSS/BLUE SHIELD | Primary: Physician Assistant

## 2024-04-13 ENCOUNTER — Encounter

## 2024-04-13 DIAGNOSIS — M25552 Pain in left hip: Principal | ICD-10-CM

## 2024-06-17 ENCOUNTER — Encounter

## 2024-07-10 ENCOUNTER — Inpatient Hospital Stay: Admit: 2024-07-10 | Payer: BLUE CROSS/BLUE SHIELD | Primary: Physician Assistant

## 2024-07-10 DIAGNOSIS — M5416 Radiculopathy, lumbar region: Principal | ICD-10-CM

## 2024-07-10 DIAGNOSIS — M48061 Spinal stenosis, lumbar region without neurogenic claudication: Secondary | ICD-10-CM

## 2024-07-13 ENCOUNTER — Emergency Department: Admission: EM | Admit: 2024-07-13 | Discharge: 2024-07-13 | Disposition: A | Discharge status: Home / Self Care

## 2024-07-13 ENCOUNTER — Emergency Department

## 2024-07-13 DIAGNOSIS — Z720 Tobacco use: Secondary | ICD-10-CM | POA: Diagnosis not present

## 2024-07-13 DIAGNOSIS — R079 Chest pain, unspecified: Secondary | ICD-10-CM

## 2024-07-13 DIAGNOSIS — J9801 Acute bronchospasm: Secondary | ICD-10-CM | POA: Insufficient documentation

## 2024-07-13 DIAGNOSIS — R0602 Shortness of breath: Secondary | ICD-10-CM | POA: Diagnosis present

## 2024-07-13 LAB — BASIC METABOLIC PANEL WITH GFR
Anion gap: 12 (ref 5–15)
BUN: 14 mg/dL (ref 8–23)
CO2: 23 mmol/L (ref 22–32)
Calcium: 9.2 mg/dL (ref 8.9–10.3)
Chloride: 103 mmol/L (ref 98–111)
Creatinine, Ser: 0.65 mg/dL (ref 0.44–1.00)
GFR, Estimated: >60 mL/min
Glucose, Bld: 101 mg/dL — ABNORMAL HIGH (ref 70–99)
Potassium: 4 mmol/L (ref 3.5–5.1)
Sodium: 138 mmol/L (ref 135–145)

## 2024-07-13 LAB — CBC
HCT: 42.8 % (ref 36.0–46.0)
Hemoglobin: 14.4 g/dL (ref 12.0–15.0)
MCH: 30.8 pg (ref 26.0–34.0)
MCHC: 33.6 g/dL (ref 30.0–36.0)
MCV: 91.5 fL (ref 80.0–100.0)
Platelets: 199 K/uL (ref 150–400)
RBC: 4.68 MIL/uL (ref 3.87–5.11)
RDW: 13.1 % (ref 11.5–15.5)
WBC: 8.9 K/uL (ref 4.0–10.5)
nRBC: 0 % (ref 0.0–0.2)

## 2024-07-13 LAB — TROPONIN T, HIGH SENSITIVITY: Troponin T High Sensitivity: <15 ng/L (ref 0–19)

## 2024-07-13 MED ORDER — PREDNISONE 20 MG PO TABS: 20.0000 mg | ORAL_TABLET | Freq: Two times a day (BID) | ORAL | 0 refills | Status: AC

## 2024-07-13 MED ORDER — ALBUTEROL SULFATE HFA 108 (90 BASE) MCG/ACT IN AERS: 2.0000 | INHALATION_SPRAY | Freq: Four times a day (QID) | RESPIRATORY_TRACT | 2 refills | Status: AC | PRN

## 2024-07-13 MED ORDER — IPRATROPIUM-ALBUTEROL 0.5-2.5 (3) MG/3ML IN SOLN
3.0000 mL | Freq: Once | RESPIRATORY_TRACT | Status: AC
  Administered 2024-07-13: 3 mL via RESPIRATORY_TRACT
  Filled 2024-07-13: qty 3

## 2024-07-13 MED ORDER — PREDNISONE 20 MG PO TABS
60.0000 mg | ORAL_TABLET | Freq: Once | ORAL | Status: AC
  Administered 2024-07-13: 60 mg via ORAL
  Filled 2024-07-13: qty 3

## 2024-07-13 NOTE — ED Provider Notes (Signed)
 "  [facility] Provider Note    Event Date/Time   First MD Initiated Contact with Patient 07/13/24 1401     (approximate)   History   Chest Pain   HPI  Courtney Villarreal is a 63 y.o. female past medical history of tobacco use, B12 deficiency hyperlipidemia anxiety, chronic back pain who presents to the emergency department with 3 weeks of chest tightness and shortness of breath.  Patient states that she came today as she feels that her chest tightness is not getting better.  There is associated cough.  No fevers but she does have a sore throat.  Denies any abdominal pain nausea or vomiting.  She does not have an inhaler at home.      Physical Exam   Triage Vital Signs: ED Triage Vitals [07/13/24 1210]  Encounter Vitals Group     BP 138/84     Girls Systolic BP Percentile      Girls Diastolic BP Percentile      Boys Systolic BP Percentile      Boys Diastolic BP Percentile      Pulse Rate 84     Resp 18     Temp 98.2 F (36.8 C)     Temp Source Oral     SpO2 96 %     Weight 180 lb (81.6 kg)     Height 5' 4 (1.626 m)     Head Circumference      Peak Flow      Pain Score 7     Pain Loc      Pain Education      Exclude from Growth Chart     Most recent vital signs: Vitals:   07/13/24 1210  BP: 138/84  Pulse: 84  Resp: 18  Temp: 98.2 F (36.8 C)  SpO2: 96%    Nursing Triage Note reviewed. Vital signs reviewed and patients oxygen saturation is normoxic  General: Patient is well nourished, well developed, awake and alert, resting comfortably in no acute distress Head: Normocephalic and atraumatic Eyes: Normal inspection, extraocular muscles intact, no conjunctival pallor Ear, nose, throat: Normal external exam Neck: Normal range of motion Respiratory: Patient is in no respiratory distress, lungs wheezes throughout Cardiovascular: Patient is not tachycardic, RRR without murmur appreciated GI: Abd SNT with no guarding or rebound  Back:  Normal inspection of the back with good strength and range of motion throughout all ext Extremities: pulses intact with good cap refills, no LE pitting edema or calf tenderness Neuro: The patient is alert and oriented to person, place, and time, appropriately conversive, with 5/5 bilat UE/LE strength, no gross motor or sensory defects noted. Coordination appears to be adequate. Skin: Warm, dry, and intact Psych: normal mood and affect, no SI or HI  ED Results / Procedures / Treatments   Labs (all labs ordered are listed, but only abnormal results are displayed) Labs Reviewed  BASIC METABOLIC PANEL WITH GFR - Abnormal; Notable for the following components:      Result Value   Glucose, Bld 101 (*)    All other components within normal limits  CBC  TROPONIN T, HIGH SENSITIVITY  TROPONIN T, HIGH SENSITIVITY     EKG EKG and rhythm strip are interpreted by myself:   EKG: [Normal sinus rhythm] at heart rate of 85, normal QRS duration, QTc 402, normal  ST segments and T waves no ectopy EKG not consistent with Acute STEMI Rhythm strip: NSR in lead II  RADIOLOGY XR chest: No acute abnormality on my independent review interpretation radiologist agrees    PROCEDURES:  Critical Care performed: No  Procedures   MEDICATIONS ORDERED IN ED: Medications  ipratropium-albuterol  (DUONEB) 0.5-2.5 (3) MG/3ML nebulizer solution 3 mL (3 mLs Nebulization Given 07/13/24 1438)  predniSONE  (DELTASONE ) tablet 60 mg (60 mg Oral Given 07/13/24 1436)     IMPRESSION / MDM / ASSESSMENT AND PLAN / ED COURSE                                Differential diagnosis includes, but is not limited to, COPD exacerbation, URI, atypical ACS, pneumonia, electrolyte derangement  ED course: Patient is well-appearing and satting 96% on room air.  EKG demonstrated no evidence of acute ischemia or right heart strain.  She had no leukocytosis and no profound electrolyte derangements.  Despite 3 weeks of symptoms  her high-sensitivity troponin was not elevated.  Chest x-ray was unremarkable.  Heart score of 3.  I did consider possible pulmonary embolism however patient has no risk factors, and patient symptoms improved after a DuoNeb treatment and a dose of prednisone .  Will send the patient with an albuterol  inhaler and a prescription for 4 additional days of prednisone .  Encouraged her to follow-up with her primary care physician.  Unfortunatetly an upper respiratory infection may be occurring simultaneously with the patient arrival or institution is low on COVID and influenza test and these are being reserved for only or sickest patients.  Given that this would not change treatment in this patient COVID influenza test was deferred at this time   Clinical Course as of 07/13/24 1538  Mon Jul 13, 2024  1450 Patient reports improvement after DuoNeb and steroids.  Chest x-ray demonstrated no acute abnormality.  She feels comfortable returning home.  Will send her with an inhaler and a course of steroids [HD]  1451 Troponin T High Sensitivity: <15 Reassuring despite multiple weeks of symptoms [HD]  1451 CBC No leukocytosis or acute anemia [HD]  1451 Basic metabolic panel(!) No profound electrolyte derangements [HD]    Clinical Course User Index [HD] Nicholaus Rolland BRAVO, MD   At time of discharge there is no evidence of acute life, limb, vision, or fertility threat. Patient has stable vital signs, pain is well controlled, patient is ambulatory and p.o. tolerant.  Discharge instructions were completed using the EPIC system. I would refer you to those at this time. All warnings prescriptions follow-up etc. were discussed in detail with the patient. Patient indicates understanding and is agreeable with this plan. All questions answered.  Patient is made aware that they may return to the emergency department for any worsening or new condition or for any other emergency.  -- Risk: 5 This patient has a high risk of  morbidity due to further diagnostic testing or treatment. Rationale: This patients evaluation and management involve a high risk of morbidity due to the potential severity of presenting symptoms, need for diagnostic testing, and/or initiation of treatment that may require close monitoring. The differential includes conditions with potential for significant deterioration or requiring escalation of care. Treatment decisions in the ED, including medication administration, procedural interventions, or disposition planning, reflect this level of risk. COPA: 5 The patient has the following acute or chronic illness/injury that poses a possible threat to life or bodily function: [X] : The patient has a potentially serious acute condition or an acute exacerbation of a chronic illness requiring urgent  evaluation and management in the Emergency Department. The clinical presentation necessitates immediate consideration of life-threatening or function-threatening diagnoses, even if they are ultimately ruled out.   FINAL CLINICAL IMPRESSION(S) / ED DIAGNOSES   Final diagnoses:  Bronchospasm  Chest pain, unspecified type     Rx / DC Orders   ED Discharge Orders          Ordered    predniSONE  (DELTASONE ) 20 MG tablet  2 times daily with meals        07/13/24 1452    albuterol  (VENTOLIN  HFA) 108 (90 Base) MCG/ACT inhaler  Every 6 hours PRN        07/13/24 1452             Note:  This document was prepared using Dragon voice recognition software and may include unintentional dictation errors.   Nicholaus Rolland BRAVO, MD 07/13/24 1538  "

## 2024-07-13 NOTE — ED Triage Notes (Signed)
 Pt presents to the ED via POV from home with central chest pain for a few months, but states that it got worse within the last week. Pt reports SHOB. Pt A&Ox4. Ambulatory to triage room. NAD.

## 2024-07-13 NOTE — Discharge Instructions (Signed)
 Your next dose of steroids (prednisone ) will be due tomorrow morning.  For the next 48 hours use your inhaler every 4 hours while awake and then spaced out to as needed.  Follow-up with your primary care physician and return with any acutely worsening symptoms -- RETURN PRECAUTIONS & AFTERCARE: (ENGLISH) RETURN PRECAUTIONS: Return immediately to the emergency department or see/call your doctor if you feel worse, weak or have changes in speech or vision, are short of breath, have fever, vomiting, pain, bleeding or dark stool, trouble urinating or any new issues. Return here or see/call your doctor if not improving as expected for your suspected condition. FOLLOW-UP CARE: Call your doctor and/or any doctors we referred you to for more advice and to make an appointment. Do this today, tomorrow or after the weekend. Some doctors only take PPO insurance so if you have HMO insurance you may want to contact your HMO or your regular doctor for referral to a specialist within your plan. Either way tell the doctor's office that it was a referral from the emergency department so you get the soonest possible appointment.  YOUR TEST RESULTS: Take result reports of any blood or urine tests, imaging tests and EKG's to your doctor and any referral doctor. Have any abnormal tests repeated. Your doctor or a referral doctor can let you know when this should be done. Also make sure your doctor contacts this hospital to get any test results that are not currently available such as cultures or special tests for infection and final imaging reports, which are often not available at the time you leave the ER but which may list additional important findings that are not documented on the preliminary report. BLOOD PRESSURE: If your blood pressure was greater than 120/80 have your blood pressure rechecked within 1 to 2 weeks. MEDICATION SIDE EFFECTS: Do not drive, walk, bike, take the bus, etc. if you have received or are being  prescribed any sedating medications such as those for pain or anxiety or certain antihistamines like Benadryl . If you have been give one of these here get a taxi home or have a friend drive you home. Ask your pharmacist to counsel you on potential side effects of any new medication
